# Patient Record
Sex: Male | Born: 1945
Health system: Southern US, Community
[De-identification: ages and names within clinical notes are randomized; demographics above are authoritative.]

## PROBLEM LIST (undated history)

## (undated) DIAGNOSIS — Z973 Presence of spectacles and contact lenses: Secondary | ICD-10-CM

## (undated) DIAGNOSIS — F419 Anxiety disorder, unspecified: Secondary | ICD-10-CM

## (undated) DIAGNOSIS — I10 Essential (primary) hypertension: Secondary | ICD-10-CM

## (undated) DIAGNOSIS — E785 Hyperlipidemia, unspecified: Secondary | ICD-10-CM

## (undated) DIAGNOSIS — Z974 Presence of external hearing-aid: Secondary | ICD-10-CM

## (undated) DIAGNOSIS — R350 Frequency of micturition: Secondary | ICD-10-CM

## (undated) DIAGNOSIS — Z9889 Other specified postprocedural states: Secondary | ICD-10-CM

## (undated) DIAGNOSIS — I4949 Other premature depolarization: Secondary | ICD-10-CM

## (undated) DIAGNOSIS — C61 Malignant neoplasm of prostate: Secondary | ICD-10-CM

## (undated) DIAGNOSIS — M5030 Other cervical disc degeneration, unspecified cervical region: Secondary | ICD-10-CM

## (undated) DIAGNOSIS — M109 Gout, unspecified: Secondary | ICD-10-CM

## (undated) DIAGNOSIS — R351 Nocturia: Secondary | ICD-10-CM

## (undated) DIAGNOSIS — N200 Calculus of kidney: Secondary | ICD-10-CM

## (undated) HISTORY — PX: PROSTATE BIOPSY: SHX241

---

## 2004-08-10 ENCOUNTER — Emergency Department: Payer: Self-pay | Admitting: Emergency Medicine

## 2007-02-02 ENCOUNTER — Ambulatory Visit: Payer: Self-pay | Admitting: Internal Medicine

## 2007-12-14 ENCOUNTER — Ambulatory Visit: Payer: Self-pay | Admitting: Internal Medicine

## 2008-01-18 ENCOUNTER — Ambulatory Visit: Payer: Self-pay | Admitting: Unknown Physician Specialty

## 2012-03-29 ENCOUNTER — Ambulatory Visit: Payer: Self-pay | Admitting: Gastroenterology

## 2012-04-03 LAB — PATHOLOGY REPORT

## 2017-08-17 ENCOUNTER — Ambulatory Visit: Payer: Medicare Other | Admitting: Anesthesiology

## 2017-08-17 ENCOUNTER — Encounter
Admission: RE | Disposition: A | Discharge status: Home / Self Care | Payer: Self-pay | Source: Ambulatory Visit | Attending: Internal Medicine

## 2017-08-17 ENCOUNTER — Encounter: Payer: Self-pay | Admitting: *Deleted

## 2017-08-17 ENCOUNTER — Ambulatory Visit
Admission: RE | Admit: 2017-08-17 | Discharge: 2017-08-17 | Disposition: A | Discharge status: Home / Self Care | Payer: Medicare Other | Source: Ambulatory Visit | Attending: Internal Medicine | Admitting: Internal Medicine

## 2017-08-17 DIAGNOSIS — Z1211 Encounter for screening for malignant neoplasm of colon: Secondary | ICD-10-CM | POA: Diagnosis not present

## 2017-08-17 DIAGNOSIS — Z79899 Other long term (current) drug therapy: Secondary | ICD-10-CM | POA: Diagnosis not present

## 2017-08-17 DIAGNOSIS — I10 Essential (primary) hypertension: Secondary | ICD-10-CM | POA: Insufficient documentation

## 2017-08-17 DIAGNOSIS — D123 Benign neoplasm of transverse colon: Secondary | ICD-10-CM | POA: Diagnosis not present

## 2017-08-17 DIAGNOSIS — Z86010 Personal history of colonic polyps: Secondary | ICD-10-CM | POA: Insufficient documentation

## 2017-08-17 DIAGNOSIS — Z87891 Personal history of nicotine dependence: Secondary | ICD-10-CM | POA: Diagnosis not present

## 2017-08-17 DIAGNOSIS — K640 First degree hemorrhoids: Secondary | ICD-10-CM | POA: Insufficient documentation

## 2017-08-17 DIAGNOSIS — Z8546 Personal history of malignant neoplasm of prostate: Secondary | ICD-10-CM | POA: Diagnosis not present

## 2017-08-17 DIAGNOSIS — Z7982 Long term (current) use of aspirin: Secondary | ICD-10-CM | POA: Insufficient documentation

## 2017-08-17 HISTORY — PX: COLONOSCOPY WITH PROPOFOL: SHX5780

## 2017-08-17 HISTORY — DX: Essential (primary) hypertension: I10

## 2017-08-17 HISTORY — DX: Malignant neoplasm of prostate: C61

## 2017-08-17 SURGERY — COLONOSCOPY WITH PROPOFOL
Anesthesia: General

## 2017-08-17 MED ORDER — PROPOFOL 500 MG/50ML IV EMUL
INTRAVENOUS | Status: DC | PRN
  Administered 2017-08-17: 50 ug/kg/min via INTRAVENOUS

## 2017-08-17 MED ORDER — LACTATED RINGERS IV SOLN
INTRAVENOUS | Status: DC | PRN
  Administered 2017-08-17: 14:00:00 via INTRAVENOUS

## 2017-08-17 MED ORDER — PROPOFOL 10 MG/ML IV BOLUS
INTRAVENOUS | Status: DC | PRN
  Administered 2017-08-17: 100 mg via INTRAVENOUS

## 2017-08-17 MED ORDER — SODIUM CHLORIDE 0.9 % IV SOLN
INTRAVENOUS | Status: DC
  Administered 2017-08-17: 13:00:00 via INTRAVENOUS

## 2017-08-17 MED ORDER — LIDOCAINE HCL (CARDIAC) 20 MG/ML IV SOLN
INTRAVENOUS | Status: DC | PRN
  Administered 2017-08-17: 40 mg via INTRAVENOUS

## 2017-08-17 NOTE — Transfer of Care (Signed)
Immediate Anesthesia Transfer of Care Note  Patient: Christian Hay Sr.  Procedure(s) Performed: COLONOSCOPY WITH PROPOFOL (N/A )  Patient Location: PACU and Endoscopy Unit  Anesthesia Type:General  Level of Consciousness: awake  Airway & Oxygen Therapy: Patient Spontanous Breathing  Post-op Assessment: Report given to RN  Post vital signs: Reviewed and stable  Last Vitals:  Vitals:   08/17/17 1255  BP: 129/65  Pulse: 76  Resp: 16  Temp: (!) 35.8 C  SpO2: 97%    Last Pain:  Vitals:   08/17/17 1255  TempSrc: Tympanic         Complications: No apparent anesthesia complications

## 2017-08-17 NOTE — H&P (Signed)
Outpatient short stay form Pre-procedure 08/17/2017 1:34 PM Kynzleigh Bandel K. Alice Reichert, M.D.  Primary Physician: Emily Filbert, M.D.  Reason for visit:  Personal hx of adenomatous colon polyps.  History of present illness:  Last colonoscopy in 2013 showed a single adenoma. Patient denies abdominal pain, change in bowel habits, or rectal bleeding.    Current Facility-Administered Medications:  .  0.9 %  sodium chloride infusion, , Intravenous, Continuous, [place], Benay Pike, MD, Last Rate: 20 mL/hr at 08/17/17 1313  Medications Prior to Admission  Medication Sig Dispense Refill Last Dose  . allopurinol (ZYLOPRIM) 300 MG tablet Take 300 mg by mouth daily.   08/16/2017 at Unknown time  . ALPRAZolam (XANAX) 0.25 MG tablet Take 0.25 mg by mouth at bedtime as needed for anxiety.   Past Month at Unknown time  . aspirin EC 81 MG tablet Take 81 mg by mouth daily.   08/16/2017 at Unknown time  . Cholecalciferol 1000 units capsule Take 1,000 Units by mouth daily.   08/16/2017 at Unknown time  . docusate sodium (COLACE) 100 MG capsule Take 100 mg by mouth daily.   Past Week at Unknown time  . lisinopril-hydrochlorothiazide (PRINZIDE,ZESTORETIC) 20-12.5 MG tablet Take 1 tablet by mouth daily.   08/16/2017 at Unknown time  . magnesium oxide (MAG-OX) 400 MG tablet Take 400 mg by mouth daily.   08/16/2017 at Unknown time  . sildenafil (REVATIO) 20 MG tablet Take 20 mg by mouth as needed.     . simvastatin (ZOCOR) 20 MG tablet Take 20 mg by mouth daily.   08/16/2017 at Unknown time  . colchicine 0.6 MG tablet Take 1.2 mg by mouth as needed.   Not Taking at Unknown time     Not on File   Past Medical History:  Diagnosis Date  . Hypertension   . Prostate cancer [facility])     Review of systems:      Physical Exam  General appearance: alert, cooperative and appears stated age Resp: clear to auscultation bilaterally Cardio: regular rate and rhythm, S1, S2 normal, no murmur, click, rub or gallop GI: soft, non-tender;  bowel sounds normal; no masses,  no organomegaly Extremities: extremities normal, atraumatic, no cyanosis or edema     Planned procedures: Colonoscopy. The patient understands the nature of the planned procedure, indications, risks, alternatives and potential complications including but not limited to bleeding, infection, perforation, damage to internal organs and possible oversedation/side effects from anesthesia. The patient agrees and gives consent to proceed.  Please refer to procedure notes for findings, recommendations and patient disposition/instructions.    Beverlee Wilmarth K. Alice Reichert, M.D. Gastroenterology 08/17/2017  1:34 PM

## 2017-08-17 NOTE — Interval H&P Note (Signed)
History and Physical Interval Note:  08/17/2017 1:37 PM  Christian Leron Croak Sr.  has presented today for surgery, with the diagnosis of PERSONAL HX.OF COLON POLYPS  The various methods of treatment have been discussed with the patient and family. After consideration of risks, benefits and other options for treatment, the patient has consented to  Procedure(s): COLONOSCOPY WITH PROPOFOL (N/A) as a surgical intervention .  The patient's history has been reviewed, patient examined, no change in status, stable for surgery.  I have reviewed the patient's chart and labs.  Questions were answered to the patient's satisfaction.     On[place], [place]

## 2017-08-17 NOTE — Anesthesia Postprocedure Evaluation (Signed)
Anesthesia Post Note  Patient: Christian ANDUJO Sr.  Procedure(s) Performed: COLONOSCOPY WITH PROPOFOL (N/A )  Patient location during evaluation: Endoscopy Anesthesia Type: General Level of consciousness: awake Pain management: satisfactory to patient Vital Signs Assessment: post-procedure vital signs reviewed and stable Respiratory status: spontaneous breathing Cardiovascular status: blood pressure returned to baseline Postop Assessment: no headache Anesthetic complications: no     Last Vitals:  Vitals:   08/17/17 1255  BP: 129/65  Pulse: 76  Resp: 16  Temp: (!) 35.8 C  SpO2: 97%    Last Pain:  Vitals:   08/17/17 1255  TempSrc: Tympanic                 Philbert Riser

## 2017-08-17 NOTE — Anesthesia Post-op Follow-up Note (Signed)
Anesthesia QCDR form completed.        

## 2017-08-17 NOTE — Op Note (Signed)
[facility] Patient Name: Christian Pope Procedure Date: 08/17/2017 1:21 PM MRN: [id number] Account #: [id number] Date of Birth: 16-Jul-1946 Admit Type: Outpatient Age: 72 Room: [facility] ENDO ROOM 3 Gender: Male Note Status: Finalized Procedure:            Colonoscopy Indications:          High risk colon cancer surveillance: Personal history                        of non-advanced adenoma, High risk colon cancer                        surveillance: Personal history of adenoma less than 10                        mm in size Providers:            Pasqual Farias K. Alice Reichert MD, MD Referring MD:         Rusty Aus, MD (Referring MD) Medicines:            Propofol per Anesthesia Complications:        No immediate complications. Procedure:            Pre-Anesthesia Assessment:                       - The risks and benefits of the procedure and the                        sedation options and risks were discussed with the                        patient. All questions were answered and informed                        consent was obtained.                       - Patient identification and proposed procedure were                        verified prior to the procedure by the nurse. The                        procedure was verified in the procedure room.                       - ASA Grade Assessment: II - A patient with mild                        systemic disease.                       - After reviewing the risks and benefits, the patient                        was deemed in satisfactory condition to undergo the                        procedure.  After obtaining informed consent, the colonoscope was                        passed under direct vision. Throughout the procedure,                        the patient's blood pressure, pulse, and oxygen                        saturations were monitored continuously. The                        Colonoscope was  introduced through the anus and                        advanced to the the cecum, identified by appendiceal                        orifice and ileocecal valve. The colonoscopy was                        performed without difficulty. The patient tolerated the                        procedure well. The quality of the bowel preparation                        was good. The ileocecal valve, appendiceal orifice, and                        rectum were photographed. Findings:      The perianal and digital rectal examinations were normal. Pertinent       negatives include normal sphincter tone and no palpable rectal lesions.      Four sessile polyps were found in the transverse colon. The polyps were       3 to 5 mm in size. These polyps were removed with a hot snare. Resection       and retrieval were complete.      Non-bleeding internal hemorrhoids were found during retroflexion. The       hemorrhoids were Grade I (internal hemorrhoids that do not prolapse).      The exam was otherwise without abnormality. Impression:           - Four 3 to 5 mm polyps in the transverse colon,                        removed with a hot snare. Resected and retrieved.                       - Non-bleeding internal hemorrhoids.                       - The examination was otherwise normal. Recommendation:       - Patient has a contact number available for                        emergencies. The signs and symptoms of potential                        delayed  complications were discussed with the patient.                        Return to normal activities tomorrow. Written discharge                        instructions were provided to the patient.                       - Resume previous diet.                       - Continue present medications.                       - Await pathology results.                       - Repeat colonoscopy for surveillance based on                        pathology results.                        - Return to GI office PRN.                       - The findings and recommendations were discussed with                        the patient and their spouse. Procedure Code(s):    --- Professional ---                       [phone], Colonoscopy, flexible; with removal of tumor(s),                        polyp(s), or other lesion(s) by snare technique Diagnosis Code(s):    --- Professional ---                       Z86.010, Personal history of colonic polyps                       K64.0, First degree hemorrhoids                       D12.3, Benign neoplasm of transverse colon (hepatic                        flexure or splenic flexure) CPT copyright 2016 American Medical Association. All rights reserved. The codes documented in this report are preliminary and upon coder review may  be revised to meet current compliance requirements. Efrain Sella MD, MD 08/17/2017 2:03:41 PM This report has been signed electronically. Number of Addenda: 0 Note Initiated On: 08/17/2017 1:21 PM Scope Withdrawal Time: 0 hours 11 minutes 47 seconds  Total Procedure Duration: 0 hours 16 minutes 12 seconds       [facility]

## 2017-08-17 NOTE — Anesthesia Preprocedure Evaluation (Addendum)
Anesthesia Evaluation  Patient identified by MRN, date of birth, ID band Patient awake    Reviewed: Allergy & Precautions, NPO status , Patient's Chart, lab work & pertinent test results  Airway Mallampati: III  TM Distance: <3 FB     Dental  (+) Caps, Chipped   Pulmonary former smoker,    Pulmonary exam normal        Cardiovascular hypertension, Normal cardiovascular exam     Neuro/Psych negative neurological ROS  negative psych ROS   GI/Hepatic negative GI ROS, Neg liver ROS,   Endo/Other  negative endocrine ROS  Renal/GU negative Renal ROS     Musculoskeletal negative musculoskeletal ROS (+)   Abdominal Normal abdominal exam  (+)   Peds negative pediatric ROS (+)  Hematology negative hematology ROS (+)   Anesthesia Other Findings Past Medical History: No date: Hypertension No date: Prostate cancer (HCC)  Reproductive/Obstetrics                            Anesthesia Physical Anesthesia Plan  ASA: II  Anesthesia Plan: General   Post-op Pain Management:    Induction: Intravenous  PONV Risk Score and Plan:   Airway Management Planned: Nasal Cannula  Additional Equipment:   Intra-op Plan:   Post-operative Plan:   Informed Consent: I have reviewed the patients History and Physical, chart, labs and discussed the procedure including the risks, benefits and alternatives for the proposed anesthesia with the patient or authorized representative who has indicated his/her understanding and acceptance.   Dental advisory given  Plan Discussed with: CRNA and Surgeon  Anesthesia Plan Comments:         Anesthesia Quick Evaluation

## 2017-08-18 ENCOUNTER — Encounter: Payer: Self-pay | Admitting: Internal Medicine

## 2017-08-19 LAB — SURGICAL PATHOLOGY

## 2017-10-20 ENCOUNTER — Other Ambulatory Visit: Payer: Self-pay | Admitting: Urology

## 2017-10-20 DIAGNOSIS — C61 Malignant neoplasm of prostate: Secondary | ICD-10-CM

## 2017-11-02 ENCOUNTER — Ambulatory Visit
Admission: RE | Admit: 2017-11-02 | Discharge: 2017-11-02 | Disposition: A | Discharge status: Home / Self Care | Payer: Medicare Other | Source: Ambulatory Visit | Attending: Urology | Admitting: Urology

## 2017-11-02 DIAGNOSIS — C61 Malignant neoplasm of prostate: Secondary | ICD-10-CM

## 2017-11-02 MED ORDER — GADOBENATE DIMEGLUMINE 529 MG/ML IV SOLN
20.0000 mL | Freq: Once | INTRAVENOUS | Status: AC | PRN
  Administered 2017-11-02: 20 mL via INTRAVENOUS

## 2017-11-23 ENCOUNTER — Encounter: Payer: Self-pay | Admitting: Radiation Oncology

## 2017-12-13 ENCOUNTER — Encounter: Payer: Self-pay | Admitting: Radiation Oncology

## 2017-12-13 NOTE — Progress Notes (Signed)
GU Location of Tumor / Histology: prostatic adenocarcinoma  If Prostate Cancer, Gleason Score is (3 + 4) and PSA is (8.38). Prostate volume: 47 cc  Reilley D Freda Munro Sr. was diagnosed with prostate cancer in April 2017. He has been under active surveillance since that time. Elevated PSA prompted an MRI scan which revealed a PIRADS 3 and 4 lesion within the prostate. Fusion biopsy done 11/15/2017.      Past/Anticipated interventions by urology, if any: biopsy, active surveillance, biopsy, referral to radiation oncology for radioactive seeds  Past/Anticipated interventions by medical oncology, if any: no  Weight changes, if any: no  Bowel/Bladder complaints, if any: IPSS 8. Denies dysuria or hematuria. Denies incontinence. Reports rare scant leakage with very strenuous activity.  Nausea/Vomiting, if any: no  Pain issues, if any:  no  SAFETY ISSUES:  Prior radiation? no  Pacemaker/ICD? no  Possible current pregnancy? no  Is the patient on methotrexate? no  Current Complaints / other details:  72 year old male. Married. Retired.

## 2017-12-14 ENCOUNTER — Encounter: Payer: Self-pay | Admitting: Radiation Oncology

## 2017-12-14 ENCOUNTER — Ambulatory Visit
Admission: RE | Admit: 2017-12-14 | Discharge: 2017-12-14 | Disposition: A | Discharge status: Home / Self Care | Payer: Medicare Other | Source: Ambulatory Visit | Attending: Radiation Oncology | Admitting: Radiation Oncology

## 2017-12-14 ENCOUNTER — Other Ambulatory Visit: Payer: Self-pay

## 2017-12-14 DIAGNOSIS — C61 Malignant neoplasm of prostate: Secondary | ICD-10-CM

## 2017-12-14 DIAGNOSIS — Z8042 Family history of malignant neoplasm of prostate: Secondary | ICD-10-CM | POA: Insufficient documentation

## 2017-12-14 DIAGNOSIS — Z803 Family history of malignant neoplasm of breast: Secondary | ICD-10-CM | POA: Insufficient documentation

## 2017-12-14 DIAGNOSIS — Z87891 Personal history of nicotine dependence: Secondary | ICD-10-CM | POA: Insufficient documentation

## 2017-12-14 DIAGNOSIS — F419 Anxiety disorder, unspecified: Secondary | ICD-10-CM | POA: Diagnosis not present

## 2017-12-14 DIAGNOSIS — Z7982 Long term (current) use of aspirin: Secondary | ICD-10-CM | POA: Diagnosis not present

## 2017-12-14 DIAGNOSIS — Z79899 Other long term (current) drug therapy: Secondary | ICD-10-CM | POA: Insufficient documentation

## 2017-12-14 DIAGNOSIS — I10 Essential (primary) hypertension: Secondary | ICD-10-CM | POA: Diagnosis not present

## 2017-12-14 HISTORY — DX: Anxiety disorder, unspecified: F41.9

## 2017-12-14 NOTE — Progress Notes (Signed)
See progress note under physician encounter. 

## 2017-12-14 NOTE — Progress Notes (Signed)
Radiation Oncology         [phone] ________________________________  Initial Outpatient Consultation  Name: Christian DETWILER Sr. MRN: [id number]  Date: 12/14/2017  DOB: [birth year]  JE:HUDJSH, Christian Grief, MD  Kathie Rhodes, MD   REFERRING PHYSICIAN: Kathie Rhodes, MD  DIAGNOSIS: 72 y.o. gentleman with Stage T1c adenocarcinoma of the prostate with Gleason Score of 3+4, and PSA of 8.38    ICD-10-CM   1. Malignant neoplasm of prostate ([place]) C61     HISTORY OF PRESENT ILLNESS: Christian Pope Sr. is a 72 y.o. male with a diagnosis of prostate cancer. He is a well established patient with Dr. Karsten Pope, initially diagnosed with a T1c Gleason 3+3 adenocarcinoma of the prostate with a PSA of 6.13 in 10/2014.  At that time, he elected to proceed with active surveillance and has continued in routine follow up with Dr. Karsten Pope since that time.     PSA trend: 10/08/16 5.82 04/21/17 6.90 10/13/17 8.38     Repeat PSA on 10/13/17 was 8.38. The patient presented to Dr. Karsten Pope on 10/20/17 and DRE continued to be stable with no nodularity. Elevated PSA prompted an MRI of the prostate on 11/02/17. This showed an 11 mm low T2 lesion in the lateral right mid gland to apex, likely corresponding to the patient's known macroscopic prostate cancer, with possible high-grade tumor. PI-RADS 4. 11 mm low T2 lesion in the left posterolateral apex, possibly reflecting additional site of low-grade tumor, PI-RADS 3. No evidence of extracapsular extension, seminal vesicle invasion, or metastatic disease.   The patient underwent MRI fusion targeted transrectal ultrasound with 12 biopsies of the prostate on 11/15/17. The prostate volume measured 57 cc. Out of 12 core biopsies, 7 were positive. The maximum Gleason score was 3+4, and this was seen in the left mid lateral and right mid. Gleason 3+3 was seen in the left base lateral, left apex lateral, left apex, right base lateral, and right apex lateral.     The patient reviewed  the biopsy results with his urologist and he has kindly been referred today for discussion of potential radiation treatment options.   PREVIOUS RADIATION THERAPY: No  PAST MEDICAL HISTORY:  Past Medical History:  Diagnosis Date  . Anxiety    history of anxiety/tx with xanax  . Hypertension   . Prostate cancer ([place])       PAST SURGICAL HISTORY: Past Surgical History:  Procedure Laterality Date  . COLONOSCOPY WITH PROPOFOL N/A 08/17/2017   Procedure: COLONOSCOPY WITH PROPOFOL;  Surgeon: [place], Benay Pike, MD;  Location: [facility] ENDOSCOPY;  Service: Gastroenterology;  Laterality: N/A;  . PROSTATE BIOPSY     x2    FAMILY HISTORY:  Family History  Problem Relation Age of Onset  . Melanoma Father   . Prostate cancer Father   . Breast cancer Sister   . Breast cancer Sister     SOCIAL HISTORY:  Social History   Socioeconomic History  . Marital status: Married    Spouse name: Not on file  . Number of children: Not on file  . Years of education: Not on file  . Highest education level: Not on file  Occupational History  . Occupation: retired  Scientific laboratory technician  . Financial resource strain: Not on file  . Food insecurity:    Worry: Not on file    Inability: Not on file  . Transportation needs:    Medical: Not on file    Non-medical: Not on file  Tobacco Use  .  Smoking status: Former Smoker    Packs/day: 0.25    Years: 2.00    Pack years: 0.50    Types: Cigars    Last attempt to quit: 08/09/2017    Years since quitting: 0.3  . Smokeless tobacco: Former Systems developer    Types: [place] date: 08/09/1973  Substance and Sexual Activity  . Alcohol use: Yes    Comment: occasionally  . Drug use: No  . Sexual activity: Yes  Lifestyle  . Physical activity:    Days per week: Not on file    Minutes per session: Not on file  . Stress: Not on file  Relationships  . Social connections:    Talks on phone: Not on file    Gets together: Not on file    Attends religious service: Not on file     Active member of club or organization: Not on file    Attends meetings of clubs or organizations: Not on file    Relationship status: Not on file  . Intimate partner violence:    Fear of current or ex partner: Not on file    Emotionally abused: Not on file    Physically abused: Not on file    Forced sexual activity: Not on file  Other Topics Concern  . Not on file  Social History Narrative   Resides in [place], [place] between [place] and [place]. Approximately one hour drive to [place]. Has two adult children. Retired from [employer]. Has three grandchildren.     ALLERGIES: Patient has no known allergies.  MEDICATIONS:  Current Outpatient Medications  Medication Sig Dispense Refill  . allopurinol (ZYLOPRIM) 300 MG tablet Take by mouth.    . ALPRAZolam (XANAX) 0.25 MG tablet Take by mouth.    Marland Kitchen aspirin EC 81 MG tablet Take by mouth.    . Cholecalciferol (VITAMIN D-1000 MAX ST) 1000 units tablet Take by mouth.    . clotrimazole-betamethasone (LOTRISONE) cream Apply topically.    . docusate sodium (COLACE) 100 MG capsule Take 100 mg by mouth daily.    Marland Kitchen lisinopril-hydrochlorothiazide (PRINZIDE,ZESTORETIC) 20-12.5 MG tablet TAKE 1 TABLET BY MOUTH ONCE A DAY    . magnesium oxide (MAG-OX) 400 MG tablet Take by mouth.    . sildenafil (REVATIO) 20 MG tablet Take by mouth.    . simvastatin (ZOCOR) 20 MG tablet Take by mouth.    . colchicine 0.6 MG tablet Take 2 tablets (1.75m) by mouth at first sign of gout flare followed by 1 tablet (0.6370m after 1 hour. (Max 1.70m66mithin 1 hour)     No current facility-administered medications for this encounter.     REVIEW OF SYSTEMS:  On review of systems, the patient reports that he is doing well overall. He denies any chest pain, shortness of breath, cough, fevers, chills, night sweats, unintended weight changes. He denies any bowel disturbances, and denies abdominal pain, nausea or vomiting. He denies any new musculoskeletal or joint aches or  pains. His IPSS was 8, indicating moderate urinary symptoms. He is able to complete sexual activity with almost all attempts. A complete review of systems is obtained and is otherwise negative.    PHYSICAL EXAM:  Wt Readings from Last 3 Encounters:  12/14/17 227 lb 6.4 oz (103.1 kg)  08/17/17 221 lb (100.2 kg)   Temp Readings from Last 3 Encounters:  12/14/17 97.8 F (36.6 C) (Oral)  08/17/17 97.8 F (36.6 C) (Tympanic)   BP Readings from Last 3 Encounters:  12/14/17 140/78  08/17/17 (!) 111/50   Pulse Readings from Last 3 Encounters:  12/14/17 84  08/17/17 70   Pain Assessment Pain Score: 0-No pain/10  In general this is a well appearing caucasian gentleman in no acute distress. He is alert and oriented x4 and appropriate throughout the examination. HEENT reveals that the patient is normocephalic, atraumatic. EOMs are intact. PERRLA. Skin is intact without any evidence of gross lesions. Cardiovascular exam reveals a regular rate and rhythm, no clicks rubs or murmurs are auscultated. Chest is clear to auscultation bilaterally. Lymphatic assessment is performed and does not reveal any adenopathy in the cervical, supraclavicular, axillary, or inguinal chains. Abdomen has active bowel sounds in all quadrants and is intact. The abdomen is soft, non tender, non distended. Lower extremities are negative for pretibial pitting edema, deep calf tenderness, cyanosis or clubbing.   KPS = 100  100 - Normal; no complaints; no evidence of disease. 90   - Able to carry on normal activity; minor signs or symptoms of disease. 80   - Normal activity with effort; some signs or symptoms of disease. 7   - Cares for self; unable to carry on normal activity or to do active work. 60   - Requires occasional assistance, but is able to care for most of his personal needs. 50   - Requires considerable assistance and frequent medical care. 29   - Disabled; requires special care and assistance. 83   -  Severely disabled; hospital admission is indicated although death not imminent. 37   - Very sick; hospital admission necessary; active supportive treatment necessary. 10   - Moribund; fatal processes progressing rapidly. 0     - Dead  Karnofsky DA, Abelmann WH, Craver LS and Burchenal JH [phone]) The use of the nitrogen mustards in the palliative treatment of carcinoma: with particular reference to bronchogenic carcinoma Cancer 1 634-56  LABORATORY DATA:  No results found for: WBC, HGB, HCT, MCV, PLT No results found for: NA, K, CL, CO2 No results found for: ALT, AST, GGT, ALKPHOS, BILITOT   RADIOGRAPHY: No results found.    IMPRESSION/PLAN: 1. 72 y.o. gentleman with Stage T1c adenocarcinoma of the prostate with Gleason Score of 3+4, and PSA of 8.38. We discussed the patient's workup and outlined the nature of prostate cancer in this setting. The patient's T stage, Gleason's score, and PSA put him into the favorable intermediate risk group. Accordingly, he is eligible for a variety of potential treatment options including prostatectomy, brachytherapy, or 5.5 - 8 weeks of external radiation. We discussed the available radiation techniques, and focused on the details and logistics and delivery. We discussed and outlined the risks, benefits, short and long-term effects associated with radiotherapy and compared and contrasted these with prostatectomy. He is adamantly not interested in prostatectomy.  We also discussed the role of SpaceOAR in reducing the rectal toxicity associated with radiotherapy.  At the conclusion of our conversation, the patient is interested in moving forward with brachytherapy and use of SpaceOAR to reduce rectal toxicity from radiotherapy.  We will share our discussion with Dr. Karsten Pope and move forward with scheduling his CT [facility] planning appointment in the near future.  The patient met briefly with Romie Jumper in our office who will be working closely with him to coordinate OR  scheduling and pre and post procedure appointments.  We will contact the pharmaceutical rep to ensure that [place] is available at the time of procedure.  He will have a prostate MRI following  his post-seed CT SIM to confirm appropriate distribution of the [place].    [name], [name]    Tyler Pita, MD  [facility] Direct Dial: [phone]  Fax: [phone] [email or web address]  Skype  [email or web address]    Page Me   This document serves as a record of services personally performed by Tyler Pita, MD and Freeman Caldron, [name]. It was created on their behalf by Bethann Humble, a trained medical scribe. The creation of this record is based on the scribe's personal observations and the provider's statements to them. This document has been checked and approved by the attending provider.

## 2017-12-16 ENCOUNTER — Telehealth: Payer: Self-pay | Admitting: *Deleted

## 2017-12-16 NOTE — Telephone Encounter (Signed)
CALLED PATIENT TO INFORM OF PRE-SEED PLANNING CT FOR - 01/04/18- ARRIVAL TIME - 8:45 AM, SPOKE WITH PATIENT AND HE IS AWARE OF THIS APPT.

## 2017-12-20 ENCOUNTER — Telehealth: Payer: Self-pay | Admitting: *Deleted

## 2017-12-20 ENCOUNTER — Telehealth: Payer: Self-pay | Admitting: Medical Oncology

## 2017-12-20 NOTE — Telephone Encounter (Signed)
Spoke with Christian Pope and introduced myself as the prostate nurse navigator and my role. I was unable to meet him the day he consulted with Dr. Lesle Reek. He states the consult went well and he has done lots of research on the seed implant. He did not have any questions at this time but voiced he will call if any arise. He is scheduled for CT simulation 01/01/18 and he is aware of this appointment.

## 2017-12-20 NOTE — Telephone Encounter (Signed)
Called patient to inform of pre-seed planning CT has been moved back to 01-04-18, spoke with patient and he is aware of these appts.

## 2017-12-20 NOTE — Telephone Encounter (Signed)
Called patient to inform of pre-seed planning CT for 01-06-18, spoke with patient and he is aware of these appts.

## 2017-12-30 ENCOUNTER — Other Ambulatory Visit: Payer: Self-pay | Admitting: Urology

## 2018-01-03 ENCOUNTER — Telehealth: Payer: Self-pay | Admitting: *Deleted

## 2018-01-03 NOTE — Telephone Encounter (Signed)
CALLED PATIENT TO REMIND OF PRE-SEED PLANNING , CHEST X-Eithen AND EKG FOR 01-04-18, SPOKE WITH PATIENT AND HE IS AWARE OF THESE APPTS.

## 2018-01-04 ENCOUNTER — Ambulatory Visit: Payer: Self-pay | Admitting: Radiation Oncology

## 2018-01-04 ENCOUNTER — Ambulatory Visit: Payer: Medicare Other | Admitting: Radiation Oncology

## 2018-01-04 ENCOUNTER — Encounter (HOSPITAL_COMMUNITY)
Admission: RE | Admit: 2018-01-04 | Discharge: 2018-01-04 | Disposition: A | Discharge status: Home / Self Care | Payer: Medicare Other | Source: Ambulatory Visit | Attending: Urology | Admitting: Urology

## 2018-01-04 ENCOUNTER — Ambulatory Visit
Admission: RE | Admit: 2018-01-04 | Discharge: 2018-01-04 | Disposition: A | Discharge status: Home / Self Care | Payer: Medicare Other | Source: Ambulatory Visit | Attending: Radiation Oncology | Admitting: Radiation Oncology

## 2018-01-04 ENCOUNTER — Ambulatory Visit (HOSPITAL_COMMUNITY)
Admission: RE | Admit: 2018-01-04 | Discharge: 2018-01-04 | Disposition: A | Discharge status: Home / Self Care | Payer: Medicare Other | Source: Ambulatory Visit | Attending: Urology | Admitting: Urology

## 2018-01-04 DIAGNOSIS — Z510 Encounter for antineoplastic radiation therapy: Secondary | ICD-10-CM | POA: Diagnosis not present

## 2018-01-04 DIAGNOSIS — C61 Malignant neoplasm of prostate: Secondary | ICD-10-CM | POA: Insufficient documentation

## 2018-01-04 NOTE — Progress Notes (Signed)
  Radiation Oncology         [phone]) [phone] ________________________________  Name: Christian Hay Sr. MRN: [id number]  Date: 01/04/2018  DOB: August 28, 1945  SIMULATION AND TREATMENT PLANNING NOTE PUBIC ARCH STUDY  HA:LPFXTK, Christean Grief, MD  Kathie Rhodes, MD  DIAGNOSIS: 72 y.o. male with Stage T1c adenocarcinoma of the prostate with Gleason Score of 3+4, and PSA of 8.38     ICD-10-CM   1. Malignant neoplasm of prostate ([place]) C61     COMPLEX SIMULATION:  The patient presented today for evaluation for possible prostate seed implant. He was brought to the radiation planning suite and placed supine on the CT couch. A 3-dimensional image study set was obtained in upload to the planning computer. There, on each axial slice, I contoured the prostate gland. Then, using three-dimensional radiation planning tools I reconstructed the prostate in view of the structures from the transperineal needle pathway to assess for possible pubic arch interference. In doing so, I did not appreciate any pubic arch interference. Also, the patient's prostate volume was estimated based on the drawn structure. The volume was 53 cc.  Given the pubic arch appearance and prostate volume, patient remains a good candidate to proceed with prostate seed implant. Today, he freely provided informed written consent to proceed.    PLAN: The patient will undergo prostate seed implant.   ________________________________  Sheral Apley. Tammi Klippel, M.D.  This document serves as a record of services personally performed by Tyler Pita, MD. It was created on his behalf by Rae Lips, a trained medical scribe. The creation of this record is based on the scribe's personal observations and the provider's statements to them. This document has been checked and approved by the attending provider.

## 2018-01-06 ENCOUNTER — Ambulatory Visit: Payer: Medicare Other | Admitting: Radiation Oncology

## 2018-02-22 ENCOUNTER — Other Ambulatory Visit: Payer: Self-pay | Admitting: Urology

## 2018-02-22 DIAGNOSIS — C61 Malignant neoplasm of prostate: Secondary | ICD-10-CM

## 2018-03-03 ENCOUNTER — Telehealth: Payer: Self-pay | Admitting: *Deleted

## 2018-03-03 NOTE — Telephone Encounter (Signed)
CALLED PATIENT TO REMIND OF LABS FOR PROCEDURE ON 03-06-18 - ARRIVAL TIME - 12:45 PM @ [facility] ADMITTING, SPOKE WITH PATIENT AND HE IS AWARE OF THIS APPT.

## 2018-03-06 ENCOUNTER — Encounter (HOSPITAL_COMMUNITY)
Admission: RE | Admit: 2018-03-06 | Discharge: 2018-03-06 | Disposition: A | Discharge status: Home / Self Care | Payer: Medicare Other | Source: Ambulatory Visit | Attending: Urology | Admitting: Urology

## 2018-03-06 DIAGNOSIS — Z01812 Encounter for preprocedural laboratory examination: Secondary | ICD-10-CM | POA: Insufficient documentation

## 2018-03-06 LAB — COMPREHENSIVE METABOLIC PANEL
ALT: 38 U/L (ref 0–44)
AST: 27 U/L (ref 15–41)
Albumin: 4.1 g/dL (ref 3.5–5.0)
Alkaline Phosphatase: 72 U/L (ref 38–126)
Anion gap: 8 (ref 5–15)
BUN: 29 mg/dL — ABNORMAL HIGH (ref 8–23)
CO2: 27 mmol/L (ref 22–32)
Calcium: 9.5 mg/dL (ref 8.9–10.3)
Chloride: 105 mmol/L (ref 98–111)
Creatinine, Ser: 1.3 mg/dL — ABNORMAL HIGH (ref 0.61–1.24)
GFR calc Af Amer: >60 mL/min (ref >60)
GFR calc non Af Amer: 54 mL/min — ABNORMAL LOW (ref >60)
Glucose, Bld: 102 mg/dL — ABNORMAL HIGH (ref 70–99)
Potassium: 4.4 mmol/L (ref 3.5–5.1)
Sodium: 140 mmol/L (ref 135–145)
Total Bilirubin: 0.9 mg/dL (ref 0.3–1.2)
Total Protein: 7.3 g/dL (ref 6.5–8.1)

## 2018-03-06 LAB — PROTIME-INR
INR: 0.81
Prothrombin Time: 11.1 seconds — ABNORMAL LOW (ref 11.4–15.2)

## 2018-03-06 LAB — CBC
HCT: 45.5 % (ref 39.0–52.0)
Hemoglobin: 15.7 g/dL (ref 13.0–17.0)
MCH: 31.9 pg (ref 26.0–34.0)
MCHC: 34.5 g/dL (ref 30.0–36.0)
MCV: 92.5 fL (ref 78.0–100.0)
Platelets: 242 10*3/uL (ref 150–400)
RBC: 4.92 MIL/uL (ref 4.22–5.81)
RDW: 13.2 % (ref 11.5–15.5)
WBC: 9 10*3/uL (ref 4.0–10.5)

## 2018-03-06 LAB — APTT: aPTT: 28 seconds (ref 24–36)

## 2018-03-07 ENCOUNTER — Other Ambulatory Visit: Payer: Self-pay

## 2018-03-07 ENCOUNTER — Encounter (HOSPITAL_BASED_OUTPATIENT_CLINIC_OR_DEPARTMENT_OTHER): Payer: Self-pay | Admitting: *Deleted

## 2018-03-07 NOTE — Progress Notes (Addendum)
Spoke with patient via telephone for pre-op interview. No meds am of surgery. NPO after MN. Needs Istat. Labs and EKG in epic and on chart. Fleet enema AM of surgery day.

## 2018-03-10 ENCOUNTER — Telehealth: Payer: Self-pay | Admitting: *Deleted

## 2018-03-10 NOTE — Telephone Encounter (Signed)
CALLED PATIENT TO REMIND OF PROCEDURE FOR 03-13-18, SPOKE WITH PATIENT AND HE IS AWARE OF THIS PROCEDURE

## 2018-03-12 NOTE — Progress Notes (Signed)
  Radiation Oncology         [phone]) [phone] ________________________________  Name: Christian Hay Sr. MRN: [id number]  Date: 03/12/2018  DOB: 11-30-1945       Prostate Seed Implant  HR:CBULAG, Christean Grief, MD  No ref. provider found  DIAGNOSIS: 72 y.o. male with Stage T1c adenocarcinoma of the prostate with Gleason Score of 3+4, and PSA of 8.38    ICD-10-CM   1. Prostate cancer ([place]) C61 DG Chest 2 View    DG Chest 2 View    PROCEDURE: Insertion of radioactive I-125 seeds into the prostate gland.  RADIATION DOSE: 145 Gy, definitive therapy.  TECHNIQUE: Christian Hay Sr. was brought to the operating room with the urologist. He was placed in the dorsolithotomy position. He was catheterized and a rectal tube was inserted. The perineum was shaved, prepped and draped. The ultrasound probe was then introduced into the rectum to see the prostate gland.  TREATMENT DEVICE: A needle grid was attached to the ultrasound probe stand and anchor needles were placed.  3D PLANNING: The prostate was imaged in 3D using a sagittal sweep of the prostate probe. These images were transferred to the planning computer. There, the prostate, urethra and rectum were defined on each axial reconstructed image. Then, the software created an optimized 3D plan and a few seed positions were adjusted. The quality of the plan was reviewed using [facility] information for the target and the following two organs at risk:  Urethra and Rectum.  Then the accepted plan was printed and handed off to the radiation therapist.  Under my supervision, the custom loading of the seeds and spacers was carried out and loaded into sealed vicryl sleeves.  These pre-loaded needles were then placed into the needle holder.Marland Kitchen  PROSTATE VOLUME STUDY:  Using transrectal ultrasound the volume of the prostate was verified to be 64 cc.  SPECIAL TREATMENT PROCEDURE/SUPERVISION AND HANDLING: The pre-loaded needles were then delivered under sagittal guidance. A total  of 24 needles were used to deposit 85 seeds in the prostate gland. The individual seed activity was 0.507 mCi.  SpaceOAR:  Yes  COMPLEX SIMULATION: At the end of the procedure, an anterior radiograph of the pelvis was obtained to document seed positioning and count. Cystoscopy was performed to check the urethra and bladder.  MICRODOSIMETRY: At the end of the procedure, the patient was emitting 0.075 mR/hr at 1 meter. Accordingly, he was considered safe for hospital discharge.  PLAN: The patient will return to the radiation oncology clinic for post implant CT dosimetry in three weeks.   ________________________________  Christian Pope, M.D.

## 2018-03-12 NOTE — H&P (Signed)
HPI: Christian Pope is a 72 year-old male with biopsy-proven adenocarcinoma of the prostate who presents for radioactive seed implant.  His prostate cancer was diagnosed 04/22/2016. His PSA at his time of diagnosis was 6.13. His cancer was Gleason score 3+3 = 6 in 2 cores involving 5% of one core and <5% of the other core.Marland Kitchen   His most recent PSA is 8.38. The patient states he is on active surveillance. He has not undergone surgery for treatment. He has not undergone External Beam Radiation Therapy for treatment. He has not undergone Hormonal Therapy for treatment.   He does not have urinary incontinence. He has not recently had unwanted weight loss. He is not having new bone pain.   Adenocarcinoma of the prostate: He indicated that when his PSA went up previously a trial of antibiotics was undertaken but it did not result in any decrease of his PSA. His PSA in 11/16 was 7.4. He was found to have some subtle induration of the left lobe of his prostate on DRE.  TRUS/BX 10/08/14: He did have some subtle hypoechogenicity in the peripheral zone posteriorly on the left-hand side. Prostate volume was 54 cc.  Pathology: Adenocarcinoma Gleason score 3+3 = 6 in 2 cores involving 5% of one core and <5% of the other core.  Stage: T1c  Treatment: Active surveillance  Due to a rise in his PSA to 8.38 he underwent an MRI scan of the prostate which revealed a PIRADS 3 and 4 lesion within the prostate.  Fusion biopsy 11/15/17: Prostate volume - 47 cc  Pathology: Gleason 3 + 4 = 7 in 2 cores and 3+ 3 = 6 in 5 cores.  Stage: T1c   11/22/17: He returns having undergone fusion biopsy due to an elevated PSA and a history of low-grade, low volume prostate cancer. He reported having no difficulties following his prostate biopsy.     ALLERGIES: No Allergies    MEDICATIONS: Levaquin 750 mg tablet Take the morning of your prostate biopsy.  Allopurinol 300 mg tablet Oral  Alprazolam 0.25 mg tablet 0 Oral  Aspirin Ec 81 mg  tablet, delayed release Oral  Colchicine 0.6 mg tablet 0 Oral  Lisinopril-Hydrochlorothiazide 20 mg-12.5 mg tablet Oral  Magnesium 500 mg capsule Oral  Sildenafil Citrate 20 MG Oral Tablet 0 Oral  Simvastatin 20 mg tablet Oral     GU PSH: Prostate Needle Biopsy - 11/15/2017      PSH Notes: Tonsillectomy  tooth pulled   NON-GU PSH: Remove Tonsils - 2016 Surgical Pathology, Gross And Microscopic Examination For Prostate Needle - 11/15/2017    GU PMH: Prostate Cancer (Stable), I have discussed with the patient the possibility of blood per rectum, per urethra and in the ejaculate. He was counseled to contact me if he has any difficulties following his prostate biopsy whatsoever. - 11/15/2017, (Stable), I note his prostate is unchanged with no new nodularity or induration however his PSA has bumped up to 8.38. What I have recommended is we proceed with further evaluation since it has been a couple of years since his biopsy. He is going to undergo an MRI scan of the prostate and then depending on those results we will proceed either with a fusion biopsy or continued active surveillance., - 10/20/2017 (Stable), His prostate remains benign to examination. His PSA at 6.9 is up slightly from where it was previously but remains in line with his previous values. I recommended continued active surveillance with repeat DRE and PSA again in 6 months., -  04/26/2017 (Stable), His prostate is noted to be smooth and benign to exam. His PSA at 5.8 to actually has decreased the last 2 times I checked it. We again discussed the fact that a prostate biopsy will be necessary at some point but I did not recommend it at this time again with his PSA continuing to actually decrease., - 10/14/2016 (Stable), His DRE remains benign and unchanged. His PSA at 6.13 is down from what was in 3/17. I therefore have recommended continued active surveillance with repeat DRE and PSA again in 6 months., - 04/27/2016, Adenocarcinoma of prostate, -  2017 BPH w/o LUTS (Stable), He does have some prostatic enlargement noted on rectal exam but he is not having any significant voiding symptoms. - 10/14/2016 ED due to arterial insufficiency, Erectile dysfunction due to arterial insufficiency - 2017 Other Disorder Prostate, Prostate induration - 2016      PMH Notes: History of nephrolithiasis: He has passed a stone spontaneously previously but has not had any difficulty with stones since 2011.   Erectile dysfunction: He developed mild erectile dysfunction. This has been managed with phosphodiesterase inhibitor therapy effectively.  Current treatment: Sildenafil       NON-GU PMH: Encounter for general adult medical examination without abnormal findings, Encounter for preventive health examination - 2017 Anxiety, Anxiety - 2016 Personal history of other diseases of the circulatory system, History of cardiac murmur - 2016, History of hypertension, - 2016 Personal history of other diseases of the musculoskeletal system and connective tissue, History of arthritis - 2016, History of gout, - 2016, History of degenerative disc disease, - 2016 Personal history of other endocrine, nutritional and metabolic disease, History of hyperlipidemia - 2016    FAMILY HISTORY: malignant neoplasm - Runs In Family   SOCIAL HISTORY: Marital Status: Married Preferred Language: English; Ethnicity: Not Hispanic Or Latino; Race: White Current Smoking Status: Patient smokes occasionally. Smokes less than 1/2 pack per day.  Drinks 3 drinks per week. Types of alcohol consumed: Beer, Liquor, Wine. Light Drinker.  Drinks 2 caffeinated drinks per day. Patient's occupation is/was retired.     Notes: Alcohol use, Caffeine use, Married, Current some day smoker, Cigar smoker   REVIEW OF SYSTEMS:    GU Review Male:   Patient denies frequent urination, hard to postpone urination, burning/ pain with urination, get up at night to urinate, leakage of urine, stream starts and  stops, trouble starting your stream, have to strain to urinate , erection problems, and penile pain.  Gastrointestinal (Upper):   Patient denies nausea, vomiting, and indigestion/ heartburn.  Gastrointestinal (Lower):   Patient denies diarrhea and constipation.  Constitutional:   Patient denies weight loss, fever, fatigue, and night sweats.  Skin:   Patient denies skin rash/ lesion and itching.  Eyes:   Patient denies blurred vision and double vision.  Ears/ Nose/ Throat:   Patient denies sore throat and sinus problems.  Hematologic/Lymphatic:   Patient denies swollen glands and easy bruising.  Cardiovascular:   Patient denies leg swelling and chest pains.  Respiratory:   Patient denies cough and shortness of breath.  Endocrine:   Patient denies excessive thirst.  Musculoskeletal:   Patient denies back pain and joint pain.  Neurological:   Patient denies headaches and dizziness.  Psychologic:   Patient denies depression and anxiety.   VITAL SIGNS:  Weight 232 lb / 105.23 kg  Height 67 in / 170.18 cm  BP 110/57 mmHg  Pulse 68 /min  BMI 36.3 kg/m    Physical  Exam  Constitutional: Well nourished and well developed . No acute distress.   ENT:. The ears and nose are normal in appearance.   Neck: The appearance of the neck is normal and no neck mass is present.   Pulmonary: No respiratory distress and normal respiratory rhythm and effort.   Cardiovascular: Heart rate and rhythm are normal . No peripheral edema.   Abdomen: The abdomen is mildly obese. The abdomen is soft and nontender. No masses are palpated. No CVA tenderness. No hernias are palpable. No hepatosplenomegaly noted.   Rectal: Rectal exam demonstrates normal sphincter tone, no tenderness and no masses. Estimated prostate size is 1+. The prostate has no nodularity, is indurated involving the entire left lobe of the prostate and is not tender. The left seminal vesicle is nonpalpable. The right seminal vesicle is  nonpalpable. The perineum is normal on inspection.   Genitourinary: Examination of the penis demonstrates no discharge, no masses, no lesions and a normal meatus. The penis is circumcised. The scrotum is without lesions. The right epididymis is palpably normal and non-tender. The left epididymis is palpably normal and non-tender. The right testis is non-tender and without masses. The left testis is non-tender and without masses.   Lymphatics: The femoral and inguinal nodes are not enlarged or tender.   Skin: Normal skin turgor, no visible rash and no visible skin lesions.   Neuro/Psych:. Mood and affect are appropriate.    PAST DATA REVIEWED:  Source Of History:  Patient   10/13/17 04/21/17 10/08/16 04/22/16 10/15/15 04/23/15  PSA  Total PSA 8.38 ng/mL 6.90 ng/mL 5.82 ng/dl 6.13  6.97  1.72     PROCEDURES:          Urinalysis Dipstick Dipstick Cont'd Micro  Color: Yellow Bilirubin: Neg WBC/hpf: 0 - 5/hpf  Appearance: Clear Ketones: Neg RBC/hpf: 10 - 20/hpf  Specific Gravity: 1.020 Blood: 3+ Bacteria: NS (Not Seen)  pH: 6.5 Protein: Trace Cystals: NS (Not Seen)  Glucose: Neg Urobilinogen: 0.2 Casts: NS (Not Seen)    Nitrites: Neg Trichomonas: Not Present    Leukocyte Esterase: Neg Mucous: Not Present      Epithelial Cells: 0 - 5/hpf      Yeast: NS (Not Seen)      Sperm: Not Present    ASSESSMENT/PLAN:     ICD-10 Details  1 GU:   Prostate Cancer - I went over his pathology report with him as well as the significance of his Gleason score, number and location of cores positive and percent of cores positive. We then discussed his Partin table results in detail and the significance of these predictions as far as prognosis and need for further workup are concerned. I then again discussed with him the various options available including active surveillance and treatment for cure such as radiation and surgery. I did discuss radioactive seed implantation with him in detail including how the  procedure is performed, its risks and complications, the probability of success, the outpatient nature of the procedure as well as the anticipated postoperative course.  He understands and elected to proceed.

## 2018-03-13 ENCOUNTER — Encounter (HOSPITAL_BASED_OUTPATIENT_CLINIC_OR_DEPARTMENT_OTHER): Payer: Self-pay | Admitting: Anesthesiology

## 2018-03-13 ENCOUNTER — Ambulatory Visit (HOSPITAL_BASED_OUTPATIENT_CLINIC_OR_DEPARTMENT_OTHER): Payer: Medicare Other | Admitting: Anesthesiology

## 2018-03-13 ENCOUNTER — Ambulatory Visit (HOSPITAL_BASED_OUTPATIENT_CLINIC_OR_DEPARTMENT_OTHER)
Admission: RE | Admit: 2018-03-13 | Discharge: 2018-03-13 | Disposition: A | Discharge status: Home / Self Care | Payer: Medicare Other | Source: Ambulatory Visit | Attending: Urology | Admitting: Urology

## 2018-03-13 ENCOUNTER — Encounter (HOSPITAL_BASED_OUTPATIENT_CLINIC_OR_DEPARTMENT_OTHER)
Admission: RE | Disposition: A | Discharge status: Home / Self Care | Payer: Self-pay | Source: Ambulatory Visit | Attending: Urology

## 2018-03-13 ENCOUNTER — Ambulatory Visit (HOSPITAL_COMMUNITY): Payer: Medicare Other

## 2018-03-13 DIAGNOSIS — E785 Hyperlipidemia, unspecified: Secondary | ICD-10-CM | POA: Diagnosis not present

## 2018-03-13 DIAGNOSIS — M1990 Unspecified osteoarthritis, unspecified site: Secondary | ICD-10-CM | POA: Insufficient documentation

## 2018-03-13 DIAGNOSIS — N529 Male erectile dysfunction, unspecified: Secondary | ICD-10-CM | POA: Insufficient documentation

## 2018-03-13 DIAGNOSIS — Z7982 Long term (current) use of aspirin: Secondary | ICD-10-CM | POA: Insufficient documentation

## 2018-03-13 DIAGNOSIS — Z87442 Personal history of urinary calculi: Secondary | ICD-10-CM | POA: Diagnosis not present

## 2018-03-13 DIAGNOSIS — F419 Anxiety disorder, unspecified: Secondary | ICD-10-CM | POA: Insufficient documentation

## 2018-03-13 DIAGNOSIS — C61 Malignant neoplasm of prostate: Secondary | ICD-10-CM | POA: Insufficient documentation

## 2018-03-13 DIAGNOSIS — M109 Gout, unspecified: Secondary | ICD-10-CM | POA: Diagnosis not present

## 2018-03-13 DIAGNOSIS — Z79899 Other long term (current) drug therapy: Secondary | ICD-10-CM | POA: Insufficient documentation

## 2018-03-13 DIAGNOSIS — I10 Essential (primary) hypertension: Secondary | ICD-10-CM | POA: Diagnosis not present

## 2018-03-13 HISTORY — PX: SPACE OAR INSTILLATION: SHX6769

## 2018-03-13 HISTORY — DX: Other specified postprocedural states: Z98.890

## 2018-03-13 HISTORY — DX: Presence of spectacles and contact lenses: Z97.3

## 2018-03-13 HISTORY — DX: Frequency of micturition: R35.0

## 2018-03-13 HISTORY — PX: RADIOACTIVE SEED IMPLANT: SHX5150

## 2018-03-13 HISTORY — DX: Nocturia: R35.1

## 2018-03-13 LAB — POCT I-STAT, CHEM 8
BUN: 25 mg/dL — ABNORMAL HIGH (ref 8–23)
Calcium, Ion: 1.23 mmol/L (ref 1.15–1.40)
Chloride: 104 mmol/L (ref 98–111)
Creatinine, Ser: 1.1 mg/dL (ref 0.61–1.24)
Glucose, Bld: 118 mg/dL — ABNORMAL HIGH (ref 70–99)
HCT: 45 % (ref 39.0–52.0)
Hemoglobin: 15.3 g/dL (ref 13.0–17.0)
Potassium: 5.2 mmol/L — ABNORMAL HIGH (ref 3.5–5.1)
Sodium: 140 mmol/L (ref 135–145)
TCO2: 28 mmol/L (ref 22–32)

## 2018-03-13 SURGERY — INSERTION, RADIATION SOURCE, PROSTATE
Anesthesia: General | Site: Prostate

## 2018-03-13 MED ORDER — PROPOFOL 10 MG/ML IV BOLUS
INTRAVENOUS | Status: AC
  Filled 2018-03-13: qty 40

## 2018-03-13 MED ORDER — FENTANYL CITRATE (PF) 100 MCG/2ML IJ SOLN
INTRAMUSCULAR | Status: AC
  Filled 2018-03-13: qty 2

## 2018-03-13 MED ORDER — MIDAZOLAM HCL 2 MG/2ML IJ SOLN
INTRAMUSCULAR | Status: AC
  Filled 2018-03-13: qty 2

## 2018-03-13 MED ORDER — CIPROFLOXACIN HCL 500 MG PO TABS: 500.0000 mg | ORAL_TABLET | Freq: Two times a day (BID) | ORAL | 0 refills | Status: DC

## 2018-03-13 MED ORDER — ONDANSETRON HCL 4 MG/2ML IJ SOLN
INTRAMUSCULAR | Status: AC
  Filled 2018-03-13: qty 2

## 2018-03-13 MED ORDER — DEXAMETHASONE SODIUM PHOSPHATE 10 MG/ML IJ SOLN
INTRAMUSCULAR | Status: DC | PRN
  Administered 2018-03-13: 10 mg via INTRAVENOUS

## 2018-03-13 MED ORDER — STERILE WATER FOR IRRIGATION IR SOLN
Status: DC | PRN
  Administered 2018-03-13: 3 mL

## 2018-03-13 MED ORDER — OXYCODONE HCL 5 MG PO TABS
5.0000 mg | ORAL_TABLET | Freq: Once | ORAL | Status: DC | PRN
  Filled 2018-03-13: qty 1

## 2018-03-13 MED ORDER — PHENYLEPHRINE 40 MCG/ML (10ML) SYRINGE FOR IV PUSH (FOR BLOOD PRESSURE SUPPORT)
PREFILLED_SYRINGE | INTRAVENOUS | Status: AC
  Filled 2018-03-13: qty 10

## 2018-03-13 MED ORDER — DEXAMETHASONE SODIUM PHOSPHATE 10 MG/ML IJ SOLN
INTRAMUSCULAR | Status: AC
  Filled 2018-03-13: qty 1

## 2018-03-13 MED ORDER — FENTANYL CITRATE (PF) 100 MCG/2ML IJ SOLN
INTRAMUSCULAR | Status: DC | PRN
  Administered 2018-03-13 (×4): 25 ug via INTRAVENOUS

## 2018-03-13 MED ORDER — PROMETHAZINE HCL 25 MG/ML IJ SOLN
6.2500 mg | INTRAMUSCULAR | Status: DC | PRN
  Filled 2018-03-13: qty 1

## 2018-03-13 MED ORDER — PROPOFOL 10 MG/ML IV BOLUS
INTRAVENOUS | Status: DC | PRN
  Administered 2018-03-13: 200 mg via INTRAVENOUS

## 2018-03-13 MED ORDER — IOHEXOL 300 MG/ML  SOLN
INTRAMUSCULAR | Status: DC | PRN
  Administered 2018-03-13: 7 mL

## 2018-03-13 MED ORDER — FLEET ENEMA 7-19 GM/118ML RE ENEM
1.0000 | ENEMA | Freq: Once | RECTAL | Status: AC
  Administered 2018-03-13: 1 via RECTAL
  Filled 2018-03-13: qty 1

## 2018-03-13 MED ORDER — HYDROCODONE-ACETAMINOPHEN 10-325 MG PO TABS: 1.0000 | ORAL_TABLET | ORAL | 0 refills | Status: DC | PRN

## 2018-03-13 MED ORDER — LIDOCAINE 2% (20 MG/ML) 5 ML SYRINGE
INTRAMUSCULAR | Status: DC | PRN
  Administered 2018-03-13: 60 mg via INTRAVENOUS

## 2018-03-13 MED ORDER — ARTIFICIAL TEARS OPHTHALMIC OINT
TOPICAL_OINTMENT | OPHTHALMIC | Status: AC
  Filled 2018-03-13: qty 3.5

## 2018-03-13 MED ORDER — MEPERIDINE HCL 25 MG/ML IJ SOLN
6.2500 mg | INTRAMUSCULAR | Status: DC | PRN
  Filled 2018-03-13: qty 1

## 2018-03-13 MED ORDER — KETOROLAC TROMETHAMINE 30 MG/ML IJ SOLN
INTRAMUSCULAR | Status: AC
  Filled 2018-03-13: qty 1

## 2018-03-13 MED ORDER — LIDOCAINE 2% (20 MG/ML) 5 ML SYRINGE
INTRAMUSCULAR | Status: AC
  Filled 2018-03-13: qty 5

## 2018-03-13 MED ORDER — OXYCODONE HCL 5 MG/5ML PO SOLN
5.0000 mg | Freq: Once | ORAL | Status: DC | PRN
  Filled 2018-03-13: qty 5

## 2018-03-13 MED ORDER — LACTATED RINGERS IV SOLN
INTRAVENOUS | Status: DC
  Administered 2018-03-13: 1000 mL via INTRAVENOUS
  Administered 2018-03-13 (×2): via INTRAVENOUS
  Filled 2018-03-13: qty 1000

## 2018-03-13 MED ORDER — KETOROLAC TROMETHAMINE 30 MG/ML IJ SOLN
INTRAMUSCULAR | Status: DC | PRN
  Administered 2018-03-13: 30 mg via INTRAVENOUS

## 2018-03-13 MED ORDER — ONDANSETRON HCL 4 MG/2ML IJ SOLN
INTRAMUSCULAR | Status: DC | PRN
  Administered 2018-03-13: 4 mg via INTRAVENOUS

## 2018-03-13 MED ORDER — SODIUM CHLORIDE 0.9 % IV SOLN
INTRAVENOUS | Status: AC | PRN
  Administered 2018-03-13: 1000 mL

## 2018-03-13 MED ORDER — CIPROFLOXACIN IN D5W 400 MG/200ML IV SOLN
400.0000 mg | INTRAVENOUS | Status: AC
  Administered 2018-03-13: 400 mg via INTRAVENOUS
  Filled 2018-03-13: qty 200

## 2018-03-13 MED ORDER — CIPROFLOXACIN IN D5W 400 MG/200ML IV SOLN
INTRAVENOUS | Status: AC
Start: 2018-03-13 — End: ?
  Filled 2018-03-13: qty 200

## 2018-03-13 MED ORDER — MIDAZOLAM HCL 2 MG/2ML IJ SOLN
INTRAMUSCULAR | Status: DC | PRN
  Administered 2018-03-13: 2 mg via INTRAVENOUS

## 2018-03-13 MED ORDER — HYDROMORPHONE HCL 1 MG/ML IJ SOLN
0.2500 mg | INTRAMUSCULAR | Status: DC | PRN
  Filled 2018-03-13: qty 0.5

## 2018-03-13 MED FILL — CIPROFLOXACIN HCL 500 MG TA: 500 | 3 days supply | Qty: 6 | Fill #0

## 2018-03-13 MED FILL — HYDROCODON-APAP 10-325: 10-325 | 2 days supply | Qty: 10 | Fill #0

## 2018-03-13 SURGICAL SUPPLY — 37 items
BAG URINE DRAINAGE (UROLOGICAL SUPPLIES) ×2 IMPLANT
BLADE CLIPPER SURG (BLADE) ×2 IMPLANT
CATH FOLEY 2WAY SLVR  5CC 16FR (CATHETERS) ×1
CATH FOLEY 2WAY SLVR 5CC 16FR (CATHETERS) ×1 IMPLANT
CATH ROBINSON RED A/P 20FR (CATHETERS) ×2 IMPLANT
CLOTH BEACON ORANGE TIMEOUT ST (SAFETY) ×2 IMPLANT
CONT SPECI 4OZ STER CLIK (MISCELLANEOUS) ×4 IMPLANT
COVER BACK TABLE 60X90IN (DRAPES) ×2 IMPLANT
COVER MAYO STAND STRL (DRAPES) ×2 IMPLANT
DRSG TEGADERM 4X4.75 (GAUZE/BANDAGES/DRESSINGS) ×2 IMPLANT
DRSG TEGADERM 8X12 (GAUZE/BANDAGES/DRESSINGS) ×4 IMPLANT
GAUZE SPONGE 4X4 12PLY STRL LF (GAUZE/BANDAGES/DRESSINGS) ×2 IMPLANT
GLOVE BIO SURGEON STRL SZ 6 (GLOVE) ×2 IMPLANT
GLOVE BIO SURGEON STRL SZ7 (GLOVE) IMPLANT
GLOVE BIO SURGEON STRL SZ8 (GLOVE) ×6 IMPLANT
GLOVE BIOGEL PI IND STRL 6 (GLOVE) ×1 IMPLANT
GLOVE BIOGEL PI IND STRL 8 (GLOVE) ×2 IMPLANT
GLOVE BIOGEL PI INDICATOR 6 (GLOVE) ×1
GLOVE BIOGEL PI INDICATOR 8 (GLOVE) ×2
GLOVE ECLIPSE 8.0 STRL XLNG CF (GLOVE) ×2 IMPLANT
GLOVE INDICATOR 7.0 STRL GRN (GLOVE) ×2 IMPLANT
GOWN STRL REUS W/TWL LRG LVL3 (GOWN DISPOSABLE) ×6 IMPLANT
GOWN STRL REUS W/TWL XL LVL3 (GOWN DISPOSABLE) ×4 IMPLANT
HOLDER FOLEY CATH W/STRAP (MISCELLANEOUS) IMPLANT
IMPL SPACEOAR SYSTEM 10ML (MISCELLANEOUS) ×1 IMPLANT
IMPLANT SPACEOAR SYSTEM 10ML (MISCELLANEOUS) ×2
IV NS 1000ML (IV SOLUTION) ×1
IV NS 1000ML BAXH (IV SOLUTION) ×1 IMPLANT
KIT TURNOVER CYSTO (KITS) ×2 IMPLANT
MARKER SKIN DUAL TIP RULER LAB (MISCELLANEOUS) ×2 IMPLANT
PACK CYSTO (CUSTOM PROCEDURE TRAY) ×2 IMPLANT
SURGILUBE 2OZ TUBE FLIPTOP (MISCELLANEOUS) ×2 IMPLANT
SYR 10ML LL (SYRINGE) ×2 IMPLANT
TOWEL OR 17X24 6PK STRL BLUE (TOWEL DISPOSABLE) ×4 IMPLANT
UNDERPAD 30X30 (UNDERPADS AND DIAPERS) ×4 IMPLANT
WATER STERILE IRR 500ML POUR (IV SOLUTION) ×2 IMPLANT
radioactive seed ×170 IMPLANT

## 2018-03-13 NOTE — Anesthesia Procedure Notes (Signed)
Procedure Name: LMA Insertion Performed by: Wanita Chamberlain, CRNA Pre-anesthesia Checklist: Patient identified, Emergency Drugs available, Suction available, Patient being monitored and Timeout performed Patient Re-evaluated:Patient Re-evaluated prior to induction Oxygen Delivery Method: Circle system utilized Preoxygenation: Pre-oxygenation with 100% oxygen Induction Type: IV induction Ventilation: Mask ventilation without difficulty LMA: LMA inserted LMA Size: 4.0 Number of attempts: 1 Airway Equipment and Method: Bite block (gauze bite guard b/t LMA and central incisor crowns) Placement Confirmation: breath sounds checked- equal and bilateral,  CO2 detector and positive ETCO2 Tube secured with: Tape Dental Injury: Teeth and Oropharynx as per pre-operative assessment

## 2018-03-13 NOTE — Op Note (Signed)
PATIENT:  Christian Pope.  PRE-OPERATIVE DIAGNOSIS:  Adenocarcinoma of the prostate  POST-OPERATIVE DIAGNOSIS:  Same  PROCEDURE:  1. I-125 radioactive seed implantation 2. Cystoscopy  3. Placement of SpaceOAR  SURGEON:  Surgeon(s): Claybon Jabs  Radiation oncologist: Dr. Tyler Pita  ANESTHESIA:  General  EBL:  Minimal  DRAINS: None  INDICATION: Christian Pope. is a 72 year old male with biopsy-proven adenocarcinoma the prostate.  He was initially diagnosed in 9/17 with low risk prostate cancer and elected to proceed with active surveillance.  Due to a rising PSA a prostate MRI scan was obtained which revealed lesions within the prostate that were biopsied with fusion biopsy and have revealed grade progression with his Gleason score having increased from  3+ 3 to 3 +4 in 2 cores.  He therefore has elected to proceed with radioactive seed implant.  Description of procedure: After informed consent the patient was brought to the major OR, placed on the table and administered general anesthesia. He was then moved to the modified lithotomy position with his perineum perpendicular to the floor. His perineum and genitalia were then sterilely prepped. An official timeout was then performed. A 16 French Foley catheter was then placed in the bladder and filled with dilute contrast, a rectal tube was placed in the rectum and the transrectal ultrasound probe was placed in the rectum and affixed to the stand. He was then sterilely draped.  Real time ultrasonography was used along with the seed planning software [employer] Prostate vs. 4.2.2.4. This was used to develop the seed plan including the number of needles as well as number of seeds required for complete and adequate coverage.  The needles were then preloaded with seeds and spacers according to the previously developed plan.  Real-time ultrasonography was then used along with the previously developed plan to implant a total of 85 seeds  using 24 needles. This proceeded without difficulty or complication.   I then proceeded with placement of SpaceOAR by introducing a needle with the bevel angled inferiorly approximately 2 cm superior to the anus. This was angled downward and under direct ultrasound was placed within the space between the prostatic capsule and rectum. This was confirmed with a small amount of sterile saline injected and this was performed under direct ultrasound. I then attached the SpaceOAR to the needle and injected this in the space between the prostate and rectum with good placement noted.  A Foley catheter was then removed as well as the transrectal ultrasound probe and rectal probe. Flexible cystoscopy was then performed using the 17 French flexible scope which revealed a normal urethra throughout its length down to the sphincter which appeared intact. The prostatic urethra revealed bilobar hypertrophy but no evidence of obstruction, seeds, spacers or lesions. The bladder was then entered and fully and systematically inspected. The ureteral orifices were noted to be of normal configuration and position. The mucosa revealed no evidence of tumors. There were also no stones identified within the bladder. I noted no seeds or spacers on the floor of the bladder and retroflexion of the scope revealed no seeds protruding from the base of the prostate.  The cystoscope was then removed and the patient was awakened and taken to recovery room in stable and satisfactory condition. He tolerated procedure well and there were no intraoperative complications.

## 2018-03-13 NOTE — Anesthesia Preprocedure Evaluation (Addendum)
Anesthesia Evaluation  Patient identified by MRN, date of birth, ID band Patient awake    Reviewed: Allergy & Precautions, NPO status , Patient's Chart, lab work & pertinent test results  Airway Mallampati: II  TM Distance: >3 FB Neck ROM: Full    Dental no notable dental hx. (+) Teeth Intact, Dental Advisory Given, Caps,    Pulmonary neg pulmonary ROS, former smoker,    Pulmonary exam normal breath sounds clear to auscultation       Cardiovascular hypertension, Pt. on medications and Pt. on home beta blockers negative cardio ROS Normal cardiovascular exam Rhythm:Regular Rate:Normal  On a Statin- Zocor   Neuro/Psych Anxiety negative neurological ROS  negative psych ROS   GI/Hepatic negative GI ROS, Neg liver ROS,   Endo/Other  negative endocrine ROS  Renal/GU negative Renal ROSProstate CA  negative genitourinary   Musculoskeletal negative musculoskeletal ROS (+)   Abdominal (+) + obese,   Peds negative pediatric ROS (+)  Hematology negative hematology ROS (+)   Anesthesia Other Findings Hx Gout  Reproductive/Obstetrics negative OB ROS                                                            Anesthesia Evaluation  Patient identified by MRN, date of birth, ID band Patient awake    Reviewed: Allergy & Precautions, NPO status , Patient's Chart, lab work & pertinent test results  Airway Mallampati: III  TM Distance: <3 FB     Dental  (+) Caps, Chipped   Pulmonary former smoker,    Pulmonary exam normal        Cardiovascular hypertension, Normal cardiovascular exam     Neuro/Psych negative neurological ROS  negative psych ROS   GI/Hepatic negative GI ROS, Neg liver ROS,   Endo/Other  negative endocrine ROS  Renal/GU negative Renal ROS     Musculoskeletal negative musculoskeletal ROS (+)   Abdominal Normal abdominal exam  (+)   Peds negative pediatric  ROS (+)  Hematology negative hematology ROS (+)   Anesthesia Other Findings Past Medical History: No date: Hypertension No date: Prostate cancer ([facility]CC)  Reproductive/Obstetrics                            Anesthesia Physical Anesthesia Plan  ASA: II  Anesthesia Plan: General   Post-op Pain Management:    Induction: Intravenous  PONV Risk Score and Plan:   Airway Management Planned: Nasal Cannula  Additional Equipment:   Intra-op Plan:   Post-operative Plan:   Informed Consent: I have reviewed the patients History and Physical, chart, labs and discussed the procedure including the risks, benefits and alternatives for the proposed anesthesia with the patient or authorized representative who has indicated his/her understanding and acceptance.   Dental advisory given  Plan Discussed with: CRNA and Surgeon  Anesthesia Plan Comments:         Anesthesia Quick Evaluation  Anesthesia Physical Anesthesia Plan  ASA: II  Anesthesia Plan: General   Post-op Pain Management:    Induction: Intravenous  PONV Risk Score and Plan: 2 and Ondansetron and Midazolam  Airway Management Planned: LMA  Additional Equipment:   Intra-op Plan:   Post-operative Plan: Extubation in OR  Informed Consent: I have reviewed the patients History and Physical,  chart, labs and discussed the procedure including the risks, benefits and alternatives for the proposed anesthesia with the patient or authorized representative who has indicated his/her understanding and acceptance.   Dental advisory given  Plan Discussed with: CRNA  Anesthesia Plan Comments:         Anesthesia Quick Evaluation

## 2018-03-13 NOTE — Discharge Instructions (Signed)
DISCHARGE INSTRUCTIONS FOR PROSTATE SEED IMPLANTATION ° °Antibiotics °You may be given a prescription for an antibiotic to take when you arrive home. If so, be sure to take every tablet in the bottle, even if you are feeling better before the prescription is finished. If you begin itching, notice a rash or start to swell on your trunk, arms, legs and/or throat, immediately stop taking the antibiotic and call your Urologist. °Diet °Resume your usual diet when you return home. To keep your bowels moving easily and softly, drink prune, apple and cranberry juice at room temperature. You may also take a stool softener, such as Colace, which is available without prescription at local pharmacies. °Daily activities °? No driving or heavy lifting for at least two days after the implant. °? No bike riding, horseback riding or riding lawn mowers for the first month after the implant. °? Any strenuous physical activity should be approved by your doctor before you resume it. °Sexual relations °You may resume sexual relations two weeks after the procedure. A condom should be used for the first two weeks. Your semen may be dark brown or black; this is normal and is related bleeding that may have occurred during the implant. °Postoperative swelling °Expect swelling and bruising of the scrotum and perineum (the area between the scrotum and anus). Both the swelling and the bruising should resolve in l or 2 weeks. Ice packs and over- the-counter medications such as Tylenol, Advil or Aleve may lessen your discomfort. °Postoperative urination °Most men experience burning on urination and/or urinary frequency. If this becomes bothersome, contact your Urologist.  Medication can be prescribed to relieve these problems.  It is normal to have some blood in your urine for a few days after the implant. °Special instructions related to the seeds °It is unlikely that you will pass an Iodine-125 seed in your urine. The seeds are silver in color  and are about as large as a grain of rice. If you pass a seed, do not handle it with your fingers. Use a spoon to place it in an envelope or jar in place this in base occluded area such as the garage or basement for return to the radiation clinic at your convenience. ° °Contact your doctor for °? Temperature greater than 101 F °? Increasing pain °? Inability to urinate °Follow-up ° You should have follow up with your urologist and radiation oncologist about 3 weeks after the procedure. °General information regarding Iodine seeds °? Iodine-125 is a low energy radioactive material. It is not deeply penetrating and loses energy at short distances. Your prostate will absorb the radiation. Objects that are touched or used by the patient do not become radioactive. °? Body wastes (urine and stool) or body fluids (saliva, tears, semen or blood) are not radioactive. °? The Nuclear Regulatory Commission (NRC) has determined that no radiation precautions are needed for patients undergoing Iodine-125 seed implantation. The NRC states that such patients do not present a risk to the people around them, including young children and pregnant women. However, in keeping with the general principle that radiation exposure should be kept as low reasonably possible, we suggest the following: °? Children and pets should not sit on the patient's lap for the first two (2) weeks after the implant. °? Pregnant (or possibly pregnant) women should avoid prolonged, close contact with the patient for the first two (2) weeks after the implant. °? A distance of three (3) feet is acceptable. °? At a distance of three (3)   feet, there is no limit to the length of time anyone can be with the patient. ?    Post Anesthesia Home Care Instructions  Activity: Get plenty of rest for the remainder of the day. A responsible individual must stay with you for 24 hours following the procedure.  For the next 24 hours, DO NOT: -Drive a car -Conservation officer, nature -Drink alcoholic beverages -Take any medication unless instructed by your physician -Make any legal decisions or sign important papers.  Meals: Start with liquid foods such as gelatin or soup. Progress to regular foods as tolerated. Avoid greasy, spicy, heavy foods. If nausea and/or vomiting occur, drink only clear liquids until the nausea and/or vomiting subsides. Call your physician if vomiting continues.  Special Instructions/Symptoms: Your throat may feel dry or sore from the anesthesia or the breathing tube placed in your throat during surgery. If this causes discomfort, gargle with warm salt water. The discomfort should disappear within 24 hours.  No Advil, Motrin, Aleve, or Ibuprofen until 5 PM.

## 2018-03-13 NOTE — Transfer of Care (Signed)
Immediate Anesthesia Transfer of Care Note  Patient: Christian Hay Sr.  Procedure(s) Performed: RADIOACTIVE SEED IMPLANT/BRACHYTHERAPY IMPLANT (N/A Prostate) SPACE OAR INSTILLATION (N/A Prostate)  Patient Location: PACU  Anesthesia Type:General  Level of Consciousness: awake, alert , oriented and patient cooperative  Airway & Oxygen Therapy: Patient Spontanous Breathing and Patient connected to nasal cannula oxygen  Post-op Assessment: Report given to RN and Post -op Vital signs reviewed and stable  Post vital signs: Reviewed and stable  Last Vitals:  Vitals Value Taken Time  BP    Temp    Pulse    Resp    SpO2      Last Pain:  Vitals:   03/13/18 0746  TempSrc:   PainSc: 0-No pain      Patients Stated Pain Goal: 4 (17/98/10 2548)  Complications: No apparent anesthesia complications

## 2018-03-13 NOTE — Anesthesia Postprocedure Evaluation (Signed)
Anesthesia Post Note  Patient: Christian Hay Sr.  Procedure(s) Performed: RADIOACTIVE SEED IMPLANT/BRACHYTHERAPY IMPLANT (N/A Prostate) SPACE OAR INSTILLATION (N/A Prostate)     Patient location during evaluation: PACU Anesthesia Type: General Level of consciousness: awake and alert Pain management: pain level controlled Vital Signs Assessment: post-procedure vital signs reviewed and stable Respiratory status: spontaneous breathing, nonlabored ventilation and respiratory function stable Cardiovascular status: blood pressure returned to baseline and stable Postop Assessment: no apparent nausea or vomiting Anesthetic complications: no    Last Vitals:  Vitals:   03/13/18 1130 03/13/18 1145  BP: 130/67 126/61  Pulse: 78 76  Resp: 10 12  Temp:    SpO2: 98% 95%    Last Pain:  Vitals:   03/13/18 1212  TempSrc:   PainSc: 3                  Lynda Rainwater

## 2018-03-14 ENCOUNTER — Encounter (HOSPITAL_BASED_OUTPATIENT_CLINIC_OR_DEPARTMENT_OTHER): Payer: Self-pay | Admitting: Urology

## 2018-03-20 ENCOUNTER — Telehealth: Payer: Self-pay | Admitting: *Deleted

## 2018-03-20 NOTE — Telephone Encounter (Signed)
CALLED PATIENT TO INFORM OF MRI FOR 03-25-18 - ARRIVAL TIME - 12:30 PM @ [facility], NO RESTRICTIONS TO TEST, SPOKE WITH PATIENT AND HE IS AWARE OF THIS TEST

## 2018-03-23 ENCOUNTER — Telehealth: Payer: Self-pay | Admitting: *Deleted

## 2018-03-23 NOTE — Telephone Encounter (Signed)
CALLED PATIENT TO REMIND OF POST SEED APPTS. FOR 03-24-18, AND MRI FOR 03-25-18, SPOKE WITH PATIENT AND HE IS AWARE OF THESE APPTS.

## 2018-03-24 ENCOUNTER — Ambulatory Visit
Admission: RE | Admit: 2018-03-24 | Discharge: 2018-03-24 | Disposition: A | Discharge status: Home / Self Care | Payer: Medicare Other | Source: Ambulatory Visit | Attending: Radiation Oncology | Admitting: Radiation Oncology

## 2018-03-24 ENCOUNTER — Other Ambulatory Visit: Payer: Self-pay

## 2018-03-24 ENCOUNTER — Encounter: Payer: Self-pay | Admitting: Medical Oncology

## 2018-03-24 ENCOUNTER — Encounter: Payer: Self-pay | Admitting: Radiation Oncology

## 2018-03-24 VITALS — BP 132/82 | HR 74 | Temp 98.3°F | Resp 18 | Wt 225.8 lb

## 2018-03-24 DIAGNOSIS — Z7982 Long term (current) use of aspirin: Secondary | ICD-10-CM | POA: Insufficient documentation

## 2018-03-24 DIAGNOSIS — C61 Malignant neoplasm of prostate: Secondary | ICD-10-CM | POA: Diagnosis present

## 2018-03-24 DIAGNOSIS — Z923 Personal history of irradiation: Secondary | ICD-10-CM | POA: Insufficient documentation

## 2018-03-24 DIAGNOSIS — Z79899 Other long term (current) drug therapy: Secondary | ICD-10-CM | POA: Diagnosis not present

## 2018-03-24 NOTE — Progress Notes (Signed)
Received patient in the clinic for post seed follow up. Pre Seed IPSS 8. Post seed IPSS 18. Patient scheduled for MRI tomorrow at 1230 to confirm SpaceOar placement. Reports mild dysuria at the start of urination. Reports brown discharge at the start of urination as well. Denies urinary leakage or incontinence. Denies any bowel complaints. Scheduled to follow up with urologist on 04/03/2018.  BP 132/82 (BP Location: Left Arm)   Pulse 74   Temp 98.3 F (36.8 C)   Resp 18   Wt 225 lb 12.8 oz (102.4 kg)   SpO2 96%   BMI 35.37 kg/m  Wt Readings from Last 3 Encounters:  03/24/18 225 lb 12.8 oz (102.4 kg)  03/13/18 224 lb 3.2 oz (101.7 kg)  12/14/17 227 lb 6.4 oz (103.1 kg)

## 2018-03-24 NOTE — Progress Notes (Signed)
  Radiation Oncology         [phone]) [phone] ________________________________  Name: Christian Hay Sr. MRN: [id number]  Date: 03/24/2018  DOB: 24-Nov-1945  COMPLEX SIMULATION NOTE  NARRATIVE:  The patient was brought to the [facility] today following prostate seed implantation approximately one month ago.  Identity was confirmed.  All relevant records and images related to the planned course of therapy were reviewed.  Then, the patient was set-up supine.  CT images were obtained.  The CT images were loaded into the planning software.  Then the prostate and rectum were contoured.  Treatment planning then occurred.  The implanted iodine 125 seeds were identified by the physics staff for projection of radiation distribution  I have requested : 3D Simulation  I have requested a DVH of the following structures: Prostate and rectum.    ________________________________  Sheral Apley Tammi Klippel, M.D.  This document serves as a record of services personally performed by Tyler Pita, MD. It was created on his behalf by Arlyce Harman, a trained medical scribe. The creation of this record is based on the scribe's personal observations and the provider's statements to them. This document has been checked and approved by the attending provider.

## 2018-03-24 NOTE — Progress Notes (Signed)
Radiation Oncology         [phone]) [phone] ________________________________  Name: Christian Hay Sr. MRN: [id number]  Date: 03/24/2018  DOB: Jun 02, 1946  Follow-Up Visit Note  CC: Rusty Aus, MD  Kathie Rhodes, MD  Diagnosis:   72 y.o. male with Stage T1c adenocarcinoma of the prostate with Gleason Score of 3+4, and PSA of 8.38     ICD-10-CM   1. Malignant neoplasm of prostate (HCC) C61     Interval Since Last Radiation:  11 days  03/13/2018: Insertion of radioactive I-125 seeds into the prostate gland; 145 Gy, definitive therapy with SpaceOAR placement.  Narrative:  The patient returns today for routine follow-up.  He is complaining of increased urinary frequency and urinary hesitation symptoms. He filled out a questionnaire regarding urinary function today providing and overall IPSS score of 18 characterizing his symptoms as moderate with increased urgency, frequency, weak stream, intermittency and nocturia x2.  He does feel like he is generally emptying his bladder well on voiding.  He specifically denies gross hematuria, fever, chills or incontinence.  He has mild dysuria at the start of his stream.  His pre-implant score was 8. He denies any bowel symptoms.  He has a follow up visit at [facility] on 04/03/18.  ALLERGIES:  has No Known Allergies.  Meds: Current Outpatient Medications  Medication Sig Dispense Refill  . allopurinol (ZYLOPRIM) 300 MG tablet Take 300 mg by mouth every evening.     Marland Kitchen ALPRAZolam (XANAX) 0.25 MG tablet Take 0.25 mg by mouth 2 (two) times daily as needed for anxiety.     Marland Kitchen aspirin EC 81 MG tablet Take 81 mg by mouth daily. Stopped Sunday. 7/28    . Cholecalciferol (VITAMIN D-1000 MAX ST) 1000 units tablet Take by mouth daily.     . clotrimazole-betamethasone (LOTRISONE) cream Apply topically daily as needed.     . colchicine 0.6 MG tablet Take 2 tablets (1.2mg ) by mouth at first sign of gout flare followed by 1 tablet (0.6mg ) after 1 hour. (Max 1.8mg   within 1 hour)    . docusate sodium (COLACE) 100 MG capsule Take 100 mg by mouth daily.    Marland Kitchen lisinopril-hydrochlorothiazide (PRINZIDE,ZESTORETIC) 20-12.5 MG tablet TAKE 1 TABLET BY MOUTH ONCE A DAY    . magnesium oxide (MAG-OX) 400 MG tablet Take 500 mg by mouth daily. Stopped week ago    . sildenafil (REVATIO) 20 MG tablet Take by mouth daily as needed.     . simvastatin (ZOCOR) 20 MG tablet Take 20 mg by mouth.     Marland Kitchen HYDROcodone-acetaminophen (NORCO) 10-325 MG tablet Take 1-2 tablets by mouth every 4 (four) hours as needed for moderate pain. Maximum dose per 24 hours - 8 pills (Patient not taking: Reported on 03/24/2018) 10 tablet 0   No current facility-administered medications for this encounter.     Physical Findings: The patient is in no acute distress. Patient is alert and oriented.  weight is 225 lb 12.8 oz (102.4 kg). His temperature is 98.3 F (36.8 C). His blood pressure is 132/82 and his pulse is 74. His respiration is 18 and oxygen saturation is 96%. .  No significant changes.  Lab Findings: Lab Results  Component Value Date   WBC 9.0 03/06/2018   HGB 15.3 03/13/2018   HCT 45.0 03/13/2018   MCV 92.5 03/06/2018   PLT 242 03/06/2018    Radiographic Findings:  Patient underwent CT imaging in our clinic for post implant dosimetry. Dr. Tammi Klippel has personally  reviewed his CT images which appear to demonstrate an adequate distribution of radioactive seeds throughout the prostate gland. There no seeds in or near the rectum.  He is scheduled for an MRI prostate on 03/25/2018 and these images will be fused with his CT images for further evaluation.  We suspect the final radiation plan and dosimetry will show appropriate coverage of the prostate gland.   Impression: The patient is recovering from the effects of radiation. His urinary symptoms should gradually improve over the next 4-6 months. We talked about this today. He is encouraged by his improvement already and is otherwise pleased  with his outcome.   Plan: Today, I spent time talking to the patient about his prostate seed implant and resolving urinary symptoms. We also talked about long-term follow-up for prostate cancer following seed implant. He understands that ongoing PSA determinations and digital rectal exams will help perform surveillance to rule out disease recurrence. He understands what to expect with his PSA measures. Patient was also educated today about some of the long-term effects from radiation including a small risk for rectal bleeding and possibly erectile dysfunction. We talked about some of the general management approaches to these potential complications. However, I did encourage the patient to contact our office or return at any point if he has questions or concerns related to his previous radiation and prostate cancer.  _____________________________________   [name], [name]    Tyler Pita, MD  [address]: [phone]  Fax: [phone] [email or web address]  Skype  [email or web address]   This document serves as a record of services personally performed by Tyler Pita, MD. It was created on his behalf by Arlyce Harman, a trained medical scribe. The creation of this record is based on the scribe's personal observations and the provider's statements to them. This document has been checked and approved by the attending provider.

## 2018-03-25 ENCOUNTER — Ambulatory Visit (HOSPITAL_COMMUNITY)
Admission: RE | Admit: 2018-03-25 | Discharge: 2018-03-25 | Disposition: A | Discharge status: Home / Self Care | Payer: Medicare Other | Source: Ambulatory Visit | Attending: Urology | Admitting: Urology

## 2018-03-25 DIAGNOSIS — C61 Malignant neoplasm of prostate: Secondary | ICD-10-CM | POA: Insufficient documentation

## 2018-04-19 ENCOUNTER — Encounter: Payer: Self-pay | Admitting: Radiation Oncology

## 2018-04-28 NOTE — Progress Notes (Signed)
  Radiation Oncology         [phone]) [phone] ________________________________  Name: Christian Hay Sr. MRN: [id number]  Date: 04/19/2018  DOB: 09-13-1945  3D Planning Note   Prostate Brachytherapy Post-Implant Dosimetry  Diagnosis: 72 y.o. male with Stage T1c adenocarcinoma of the prostate with Gleason Score of 3+4, and PSA of 8.38   Narrative: On a previous date, [name] returned following prostate seed implantation for post implant planning. He underwent CT scan complex simulation to delineate the three-dimensional structures of the pelvis and demonstrate the radiation distribution.  Since that time, the seed localization, and complex isodose planning with dose volume histograms have now been completed.  Results:   Prostate Coverage - The dose of radiation delivered to the 90% or more of the prostate gland (D90) was 116.94% of the prescription dose. This exceeds our goal of greater than 90%. Rectal Sparing - The volume of rectal tissue receiving the prescription dose or higher was 0.0 cc. This falls under our thresholds tolerance of 1.0 cc.  Impression: The prostate seed implant appears to show adequate target coverage and appropriate rectal sparing.  Plan:  The patient will continue to follow with urology for ongoing PSA determinations. I would anticipate a high likelihood for local tumor control with minimal risk for rectal morbidity.  ________________________________  Sheral Apley Tammi Klippel, M.D.

## 2018-08-29 ENCOUNTER — Encounter: Payer: Self-pay | Admitting: *Deleted

## 2018-08-30 ENCOUNTER — Ambulatory Visit
Admission: RE | Admit: 2018-08-30 | Discharge: 2018-08-30 | Disposition: A | Discharge status: Home / Self Care | Payer: Medicare Other | Source: Ambulatory Visit | Attending: Internal Medicine | Admitting: Internal Medicine

## 2018-08-30 ENCOUNTER — Ambulatory Visit: Payer: Medicare Other | Admitting: Anesthesiology

## 2018-08-30 ENCOUNTER — Encounter
Admission: RE | Disposition: A | Discharge status: Home / Self Care | Payer: Self-pay | Source: Ambulatory Visit | Attending: Internal Medicine

## 2018-08-30 DIAGNOSIS — E785 Hyperlipidemia, unspecified: Secondary | ICD-10-CM | POA: Diagnosis not present

## 2018-08-30 DIAGNOSIS — Z87891 Personal history of nicotine dependence: Secondary | ICD-10-CM | POA: Insufficient documentation

## 2018-08-30 DIAGNOSIS — Z8546 Personal history of malignant neoplasm of prostate: Secondary | ICD-10-CM | POA: Insufficient documentation

## 2018-08-30 DIAGNOSIS — D123 Benign neoplasm of transverse colon: Secondary | ICD-10-CM | POA: Diagnosis not present

## 2018-08-30 DIAGNOSIS — K5730 Diverticulosis of large intestine without perforation or abscess without bleeding: Secondary | ICD-10-CM | POA: Insufficient documentation

## 2018-08-30 DIAGNOSIS — F419 Anxiety disorder, unspecified: Secondary | ICD-10-CM | POA: Insufficient documentation

## 2018-08-30 DIAGNOSIS — Z86010 Personal history of colonic polyps: Secondary | ICD-10-CM | POA: Diagnosis not present

## 2018-08-30 DIAGNOSIS — I10 Essential (primary) hypertension: Secondary | ICD-10-CM | POA: Insufficient documentation

## 2018-08-30 DIAGNOSIS — K921 Melena: Secondary | ICD-10-CM | POA: Insufficient documentation

## 2018-08-30 DIAGNOSIS — K641 Second degree hemorrhoids: Secondary | ICD-10-CM | POA: Insufficient documentation

## 2018-08-30 DIAGNOSIS — Z79899 Other long term (current) drug therapy: Secondary | ICD-10-CM | POA: Insufficient documentation

## 2018-08-30 DIAGNOSIS — M109 Gout, unspecified: Secondary | ICD-10-CM | POA: Insufficient documentation

## 2018-08-30 DIAGNOSIS — K625 Hemorrhage of anus and rectum: Secondary | ICD-10-CM | POA: Diagnosis present

## 2018-08-30 HISTORY — PX: COLONOSCOPY WITH PROPOFOL: SHX5780

## 2018-08-30 HISTORY — DX: Gout, unspecified: M10.9

## 2018-08-30 HISTORY — DX: Other cervical disc degeneration, unspecified cervical region: M50.30

## 2018-08-30 HISTORY — DX: Other premature depolarization: I49.49

## 2018-08-30 HISTORY — DX: Calculus of kidney: N20.0

## 2018-08-30 HISTORY — DX: Hyperlipidemia, unspecified: E78.5

## 2018-08-30 SURGERY — COLONOSCOPY WITH PROPOFOL
Anesthesia: General

## 2018-08-30 MED ORDER — PROPOFOL 10 MG/ML IV BOLUS
INTRAVENOUS | Status: DC | PRN
  Administered 2018-08-30: 20 mg via INTRAVENOUS
  Administered 2018-08-30: 40 mg via INTRAVENOUS
  Administered 2018-08-30: 50 mg via INTRAVENOUS

## 2018-08-30 MED ORDER — PROPOFOL 500 MG/50ML IV EMUL
INTRAVENOUS | Status: AC
  Filled 2018-08-30: qty 50

## 2018-08-30 MED ORDER — SODIUM CHLORIDE 0.9 % IV SOLN
INTRAVENOUS | Status: DC
  Administered 2018-08-30 (×2): via INTRAVENOUS

## 2018-08-30 MED ORDER — PROPOFOL 500 MG/50ML IV EMUL
INTRAVENOUS | Status: DC | PRN
  Administered 2018-08-30: 80 ug/kg/min via INTRAVENOUS

## 2018-08-30 NOTE — Anesthesia Preprocedure Evaluation (Signed)
Anesthesia Evaluation  Patient identified by MRN, date of birth, ID band Patient awake    Reviewed: Allergy & Precautions, NPO status , Patient's Chart, lab work & pertinent test results, reviewed documented beta blocker date and time   Airway Mallampati: III  TM Distance: >3 FB     Dental  (+) Chipped   Pulmonary former smoker,           Cardiovascular hypertension, Pt. on medications      Neuro/Psych Anxiety    GI/Hepatic   Endo/Other    Renal/GU Renal disease     Musculoskeletal  (+) Arthritis ,   Abdominal   Peds  Hematology   Anesthesia Other Findings Obese. Gout.  Reproductive/Obstetrics                             Anesthesia Physical Anesthesia Plan  ASA: III  Anesthesia Plan: General   Post-op Pain Management:    Induction: Intravenous  PONV Risk Score and Plan:   Airway Management Planned:   Additional Equipment:   Intra-op Plan:   Post-operative Plan:   Informed Consent: I have reviewed the patients History and Physical, chart, labs and discussed the procedure including the risks, benefits and alternatives for the proposed anesthesia with the patient or authorized representative who has indicated his/her understanding and acceptance.       Plan Discussed with: CRNA  Anesthesia Plan Comments:         Anesthesia Quick Evaluation

## 2018-08-30 NOTE — Anesthesia Post-op Follow-up Note (Signed)
Anesthesia QCDR form completed.        

## 2018-08-30 NOTE — Interval H&P Note (Signed)
History and Physical Interval Note:  08/30/2018 9:10 AM  Christian Leron Croak Sr.  has presented today for surgery, with the diagnosis of RECTAL BLEED HX PROSTATE  The various methods of treatment have been discussed with the patient and family. After consideration of risks, benefits and other options for treatment, the patient has consented to  Procedure(s): COLONOSCOPY WITH PROPOFOL (N/A) as a surgical intervention .  The patient's history has been reviewed, patient examined, no change in status, stable for surgery.  I have reviewed the patient's chart and labs.  Questions were answered to the patient's satisfaction.     [place], [place]

## 2018-08-30 NOTE — H&P (Signed)
Outpatient short stay form Pre-procedure 08/30/2018 9:08 AM Christian Pope, M.D.  Primary Physician: Emily Filbert, M.D.  Reason for visit:  Rectal bleeding, personal hx of prostate cancer, personal hx of colon polyps.  History of present illness:  73 y/o male with hx of prostate cancer s/p seed implantation with brachytherapy. Has intermittent BRBPR and recent passing of "Q-tip" like material.  Had small polyps removed on last colonoscopy in 08/2017.     Current Facility-Administered Medications:  .  0.9 %  sodium chloride infusion, , Intravenous, Continuous, Robertson, Benay Pike, MD, Last Rate: 20 mL/hr at 08/30/18 0820  Medications Prior to Admission  Medication Sig Dispense Refill Last Dose  . alfuzosin (UROXATRAL) 10 MG 24 hr tablet Take 10 mg by mouth daily. Take with dinner   08/29/2018  . ALPRAZolam (XANAX) 0.25 MG tablet Take 0.25 mg by mouth 2 (two) times daily as needed for anxiety.    Past Month at Unknown time  . clotrimazole-betamethasone (LOTRISONE) cream Apply topically daily as needed.    Past Month at Unknown time  . sildenafil (REVATIO) 20 MG tablet Take by mouth daily as needed.    Past Month at Unknown time  . allopurinol (ZYLOPRIM) 300 MG tablet Take 300 mg by mouth every evening.    08/27/2018  . aspirin EC 81 MG tablet Take 81 mg by mouth daily. Stopped Sunday. 7/28   08/27/2018  . Cholecalciferol (VITAMIN D-1000 MAX ST) 1000 units tablet Take by mouth daily.    08/27/2018  . colchicine 0.6 MG tablet Take 2 tablets (1.2mg ) by mouth at first sign of gout flare followed by 1 tablet (0.6mg ) after 1 hour. (Max 1.8mg  within 1 hour)   Taking  . docusate sodium (COLACE) 100 MG capsule Take 100 mg by mouth daily.   08/27/2018  . HYDROcodone-acetaminophen (NORCO) 10-325 MG tablet Take 1-2 tablets by mouth every 4 (four) hours as needed for moderate pain. Maximum dose per 24 hours - 8 pills (Patient not taking: Reported on 03/24/2018) 10 tablet 0 Not Taking  .  lisinopril-hydrochlorothiazide (PRINZIDE,ZESTORETIC) 20-12.5 MG tablet TAKE 1 TABLET BY MOUTH ONCE A DAY   08/29/2018  . magnesium oxide (MAG-OX) 400 MG tablet Take 500 mg by mouth daily. Stopped week ago   08/27/2018  . simvastatin (ZOCOR) 20 MG tablet Take 20 mg by mouth.    08/27/2018     No Known Allergies   Past Medical History:  Diagnosis Date  . Anxiety    history of anxiety/tx with xanax  . Degenerative disc disease, cervical     C5-6  . Ectopic beats   . Gout   . History of prostate biopsy 2017 & 2019  . Hyperlipidemia   . Hypertension   . Nephrolithiasis   . Nocturia   . Prostate cancer ([place])   . Urinary frequency   . Wears glasses     Review of systems:  Otherwise negative.    Physical Exam  Gen: Alert, oriented. Appears stated age.  HEENT: [place]/AT. PERRLA. Lungs: CTA, no wheezes. CV: RR nl S1, S2. Abd: soft, benign, no masses. BS+ Ext: No edema. Pulses 2+    Planned procedures: Proceed with colonoscopy. The patient understands the nature of the planned procedure, indications, risks, alternatives and potential complications including but not limited to bleeding, infection, perforation, damage to internal organs and possible oversedation/side effects from anesthesia. The patient agrees and gives consent to proceed.  Please refer to procedure notes for findings, recommendations and patient disposition/instructions.  Christian Pope, M.D. Gastroenterology 08/30/2018  9:08 AM

## 2018-08-30 NOTE — Transfer of Care (Signed)
Immediate Anesthesia Transfer of Care Note  Patient: Christian Hay Sr.  Procedure(s) Performed: COLONOSCOPY WITH PROPOFOL (N/A )  Patient Location: PACU  Anesthesia Type:General  Level of Consciousness: awake  Airway & Oxygen Therapy: Patient Spontanous Breathing  Post-op Assessment: Report given to RN  Post vital signs: stable  Last Vitals:  Vitals Value Taken Time  BP 95/50 08/30/2018  9:39 AM  Temp    Pulse 78 08/30/2018  9:40 AM  Resp 17 08/30/2018  9:40 AM  SpO2 99 % 08/30/2018  9:40 AM  Vitals shown include unvalidated device data.  Last Pain:  Vitals:   08/30/18 0803  TempSrc: Tympanic  PainSc: 0-No pain         Complications: No apparent anesthesia complications

## 2018-08-30 NOTE — Anesthesia Postprocedure Evaluation (Signed)
Anesthesia Post Note  Patient: Christian NORMOYLE Sr.  Procedure(s) Performed: COLONOSCOPY WITH PROPOFOL (N/A )  Patient location during evaluation: Endoscopy Anesthesia Type: General Level of consciousness: awake and alert Pain management: pain level controlled Vital Signs Assessment: post-procedure vital signs reviewed and stable Respiratory status: spontaneous breathing, nonlabored ventilation, respiratory function stable and patient connected to nasal cannula oxygen Cardiovascular status: blood pressure returned to baseline and stable Postop Assessment: no apparent nausea or vomiting Anesthetic complications: no     Last Vitals:  Vitals:   08/30/18 0930 08/30/18 0941  BP: (!) 95/50 (!) 95/50  Pulse: 76 78  Resp: (!) 22 17  Temp:  36.7 C  SpO2: 99% 99%    Last Pain:  Vitals:   08/30/18 0803  TempSrc: Tympanic  PainSc: 0-No pain                 Sayda Grable S

## 2018-08-30 NOTE — Op Note (Signed)
[facility] Gastroenterology Patient Name: Christian Pope Procedure Date: 08/30/2018 9:17 AM MRN: [id number] Account #: [id number] Date of Birth: 08-Apr-1946 Admit Type: Outpatient Age: 73 Room: [facility] ENDO ROOM 3 Gender: Male Note Status: Finalized Procedure:            Colonoscopy Indications:          Hematochezia Providers:            Benay Pike. Alice Reichert MD, MD Referring MD:         Rusty Aus, MD (Referring MD) Medicines:            Propofol per Anesthesia Complications:        No immediate complications. Procedure:            Pre-Anesthesia Assessment:                       - The risks and benefits of the procedure and the                        sedation options and risks were discussed with the                        patient. All questions were answered and informed                        consent was obtained.                       - Patient identification and proposed procedure were                        verified prior to the procedure by the nurse. The                        procedure was verified in the procedure room.                       - ASA Grade Assessment: III - A patient with severe                        systemic disease.                       - After reviewing the risks and benefits, the patient                        was deemed in satisfactory condition to undergo the                        procedure.                       After obtaining informed consent, the colonoscope was                        passed under direct vision. Throughout the procedure,                        the patient's blood pressure, pulse, and oxygen  saturations were monitored continuously. The                        Colonoscope was introduced through the anus and                        advanced to the the cecum, identified by appendiceal                        orifice and ileocecal valve. The colonoscopy was                        performed without  difficulty. The patient tolerated the                        procedure well. The quality of the bowel preparation                        was excellent. The ileocecal valve, appendiceal                        orifice, and rectum were photographed. Findings:      The perianal and digital rectal examinations were normal. Pertinent       negatives include normal sphincter tone and no palpable rectal lesions.      A few small-mouthed diverticula were found in the sigmoid colon.      A 4 mm polyp was found in the transverse colon. The polyp was sessile.       The polyp was removed with a jumbo cold forceps. Resection and retrieval       were complete.      Non-bleeding internal hemorrhoids were found during retroflexion. The       hemorrhoids were Grade II (internal hemorrhoids that prolapse but reduce       spontaneously).      There is no endoscopic evidence of bleeding, inflammation,       angiodysplasia or angioectasia in the entire colon. Impression:           - Diverticulosis in the sigmoid colon.                       - One 4 mm polyp in the transverse colon, removed with                        a jumbo cold forceps. Resected and retrieved.                       - Non-bleeding internal hemorrhoids. Recommendation:       - Patient has a contact number available for                        emergencies. The signs and symptoms of potential                        delayed complications were discussed with the patient.                        Return to normal activities tomorrow. Written discharge  instructions were provided to the patient.                       - Resume previous diet.                       - Continue present medications.                       - Miralax 1 capful (17 grams) in 8 ounces of water PO                        daily.                       - Repeat colonoscopy in 5 years for surveillance.                       - Return to physician assistant in 2  months.                       - The findings and recommendations were discussed with                        the patient and their spouse. Procedure Code(s):    --- Professional ---                       [id number], Colonoscopy, flexible; with biopsy, single or                        multiple Diagnosis Code(s):    --- Professional ---                       K57.30, Diverticulosis of large intestine without                        perforation or abscess without bleeding                       K92.1, Melena (includes Hematochezia)                       D12.3, Benign neoplasm of transverse colon (hepatic                        flexure or splenic flexure)                       K64.1, Second degree hemorrhoids CPT copyright 2018 American Medical Association. All rights reserved. The codes documented in this report are preliminary and upon coder review may  be revised to meet current compliance requirements. Efrain Sella MD, MD 08/30/2018 9:39:35 AM This report has been signed electronically. Number of Addenda: 0 Note Initiated On: 08/30/2018 9:17 AM Scope Withdrawal Time: 0 hours 7 minutes 30 seconds  Total Procedure Duration: 0 hours 12 minutes 2 seconds       [facility]

## 2018-08-31 ENCOUNTER — Encounter: Payer: Self-pay | Admitting: Internal Medicine

## 2018-08-31 LAB — SURGICAL PATHOLOGY

## 2019-03-04 IMAGING — DX DG CHEST 2V
2 series · 2 of 2 positions shown · non-contrast
Comparison: 12/14/2007 chest CT

CLINICAL DATA: Prostate cancer.

EXAM:
CHEST - 2 VIEW

[chest pa]
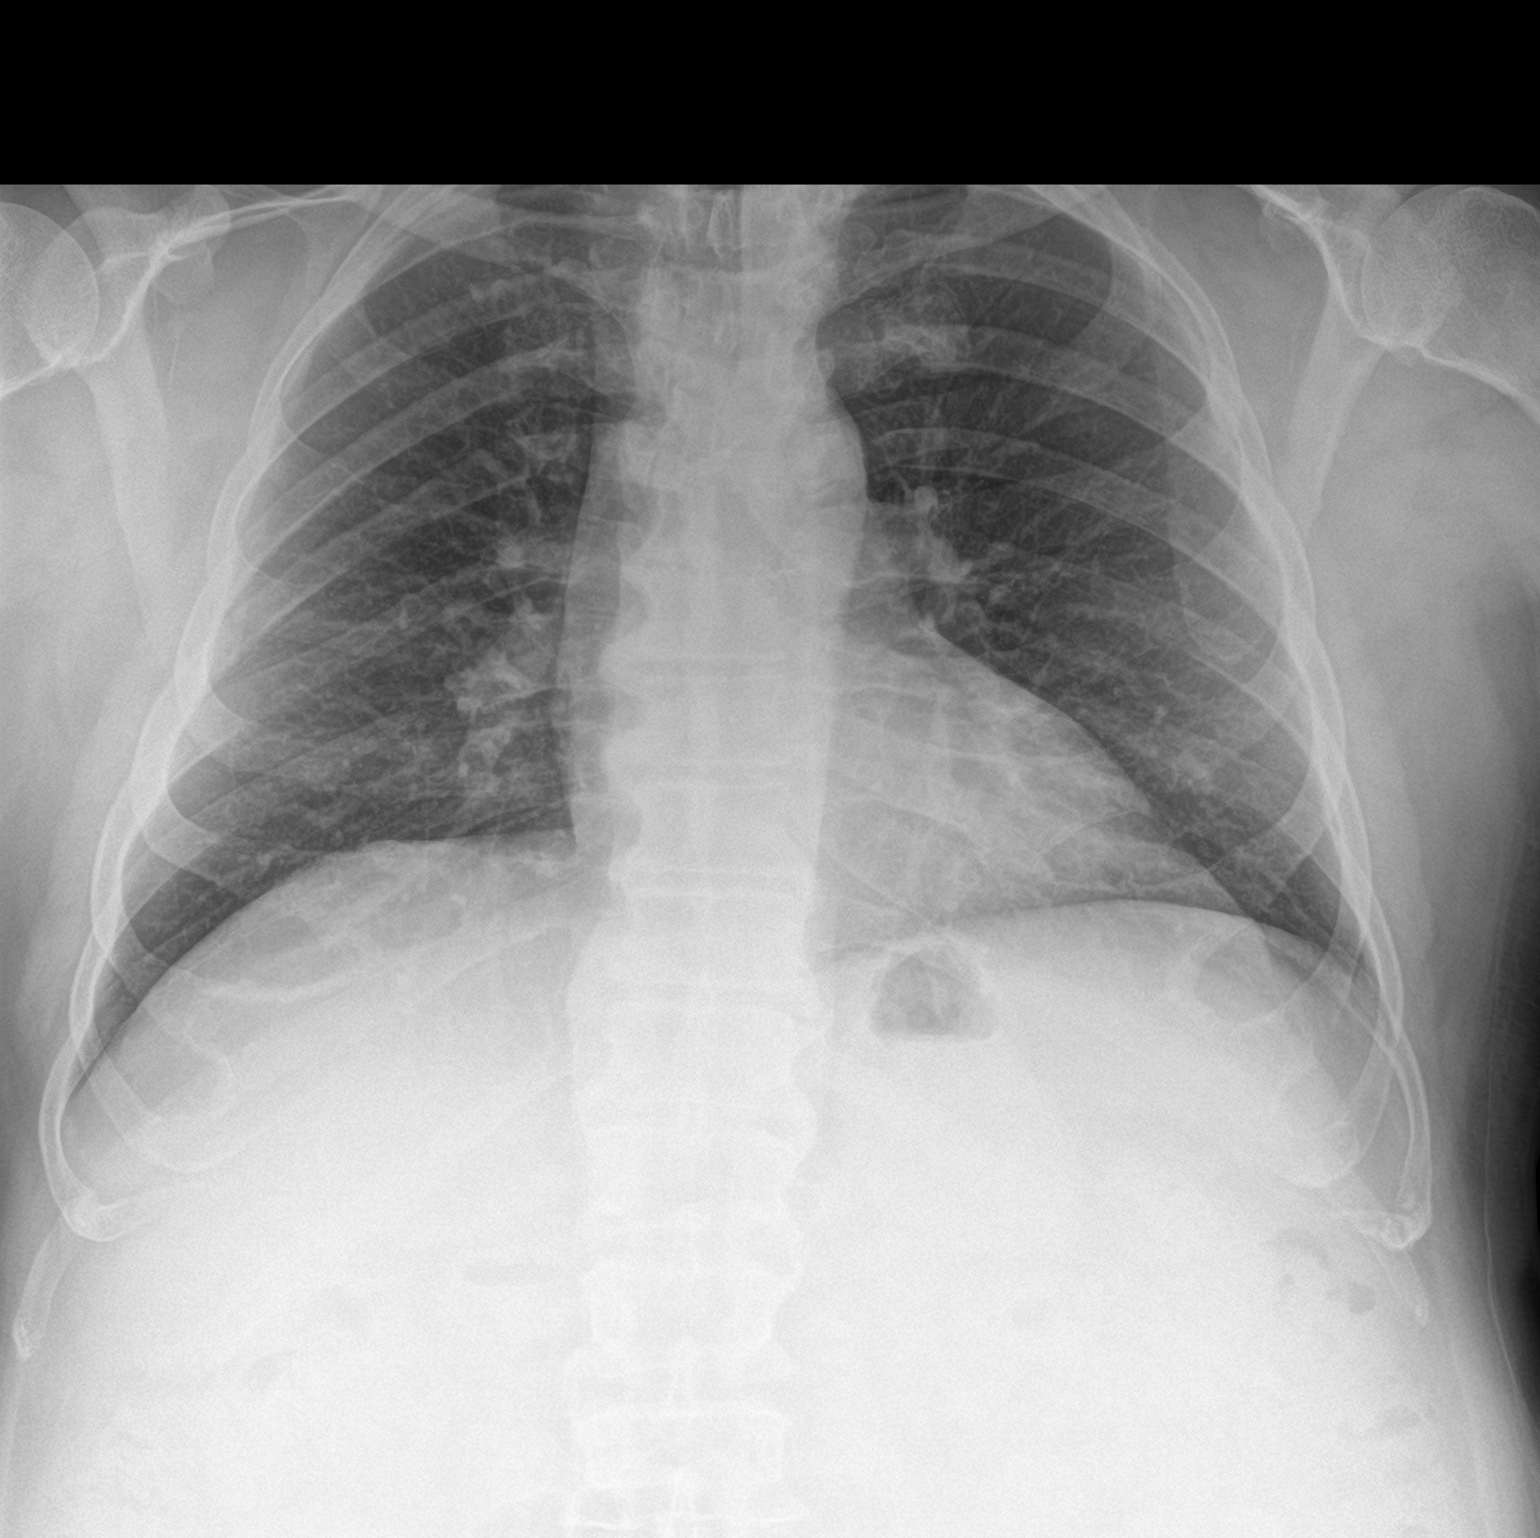

[chest lat]
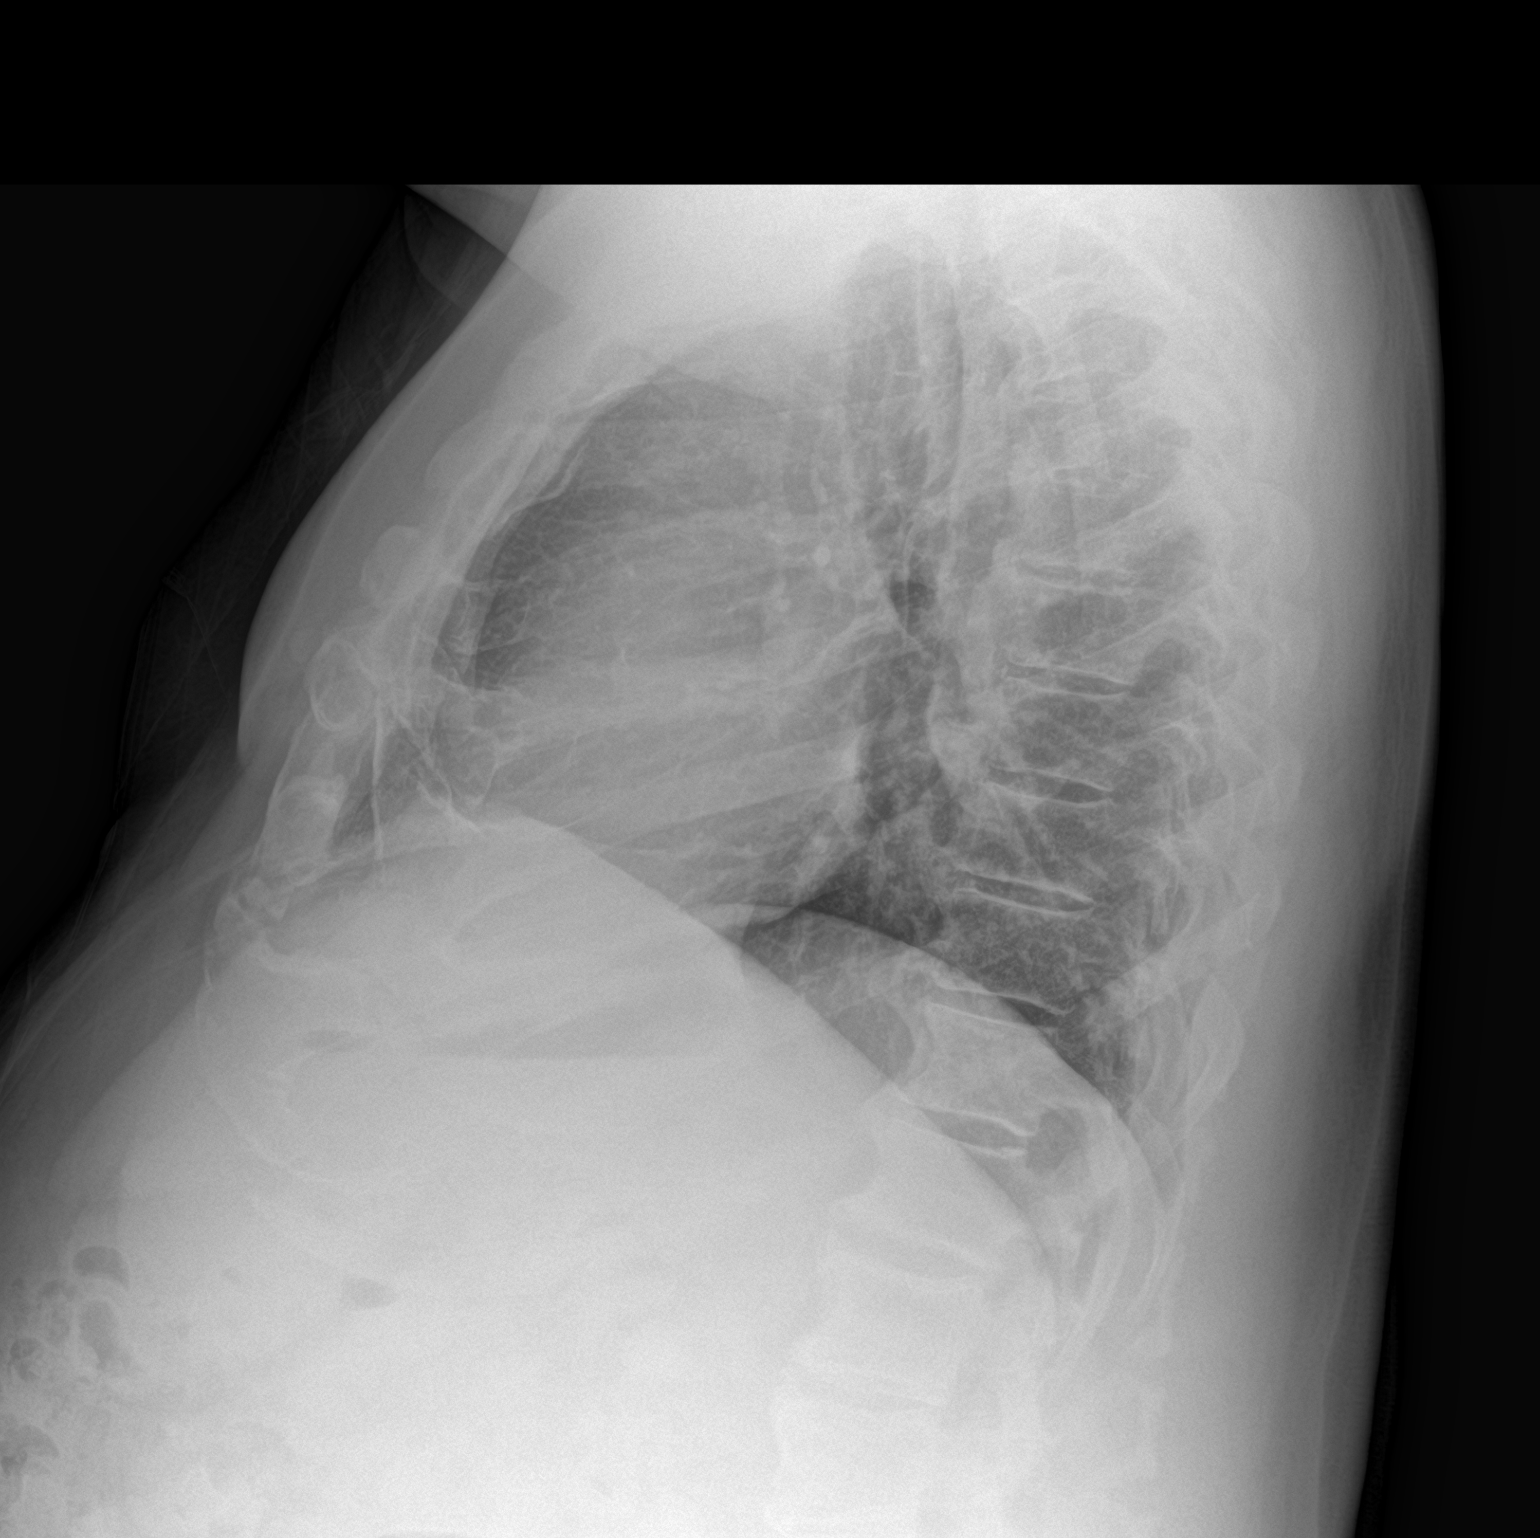

[2 of 2 positions shown; findings below may reference images not displayed]

FINDINGS: The cardiomediastinal silhouette is unremarkable.

There is no evidence of focal airspace disease, pulmonary edema,
suspicious pulmonary nodule/mass, pleural effusion, or pneumothorax.

No acute bony abnormalities are identified.
IMPRESSION: No active cardiopulmonary disease.

## 2021-12-29 ENCOUNTER — Encounter: Payer: Self-pay | Admitting: Ophthalmology

## 2022-01-05 ENCOUNTER — Ambulatory Visit: Payer: Medicare Other | Admitting: Anesthesiology

## 2022-01-05 ENCOUNTER — Ambulatory Visit
Admission: RE | Admit: 2022-01-05 | Discharge: 2022-01-05 | Disposition: A | Discharge status: Home / Self Care | Payer: Medicare Other | Source: Ambulatory Visit | Attending: Ophthalmology | Admitting: Ophthalmology

## 2022-01-05 ENCOUNTER — Encounter
Admission: RE | Disposition: A | Discharge status: Home / Self Care | Payer: Self-pay | Source: Ambulatory Visit | Attending: Ophthalmology

## 2022-01-05 ENCOUNTER — Encounter: Payer: Self-pay | Admitting: Ophthalmology

## 2022-01-05 ENCOUNTER — Other Ambulatory Visit: Payer: Self-pay

## 2022-01-05 DIAGNOSIS — Z6835 Body mass index (BMI) 35.0-35.9, adult: Secondary | ICD-10-CM | POA: Insufficient documentation

## 2022-01-05 DIAGNOSIS — Z87891 Personal history of nicotine dependence: Secondary | ICD-10-CM | POA: Insufficient documentation

## 2022-01-05 DIAGNOSIS — H2511 Age-related nuclear cataract, right eye: Secondary | ICD-10-CM | POA: Insufficient documentation

## 2022-01-05 DIAGNOSIS — M109 Gout, unspecified: Secondary | ICD-10-CM | POA: Diagnosis not present

## 2022-01-05 DIAGNOSIS — Z8546 Personal history of malignant neoplasm of prostate: Secondary | ICD-10-CM | POA: Insufficient documentation

## 2022-01-05 DIAGNOSIS — E669 Obesity, unspecified: Secondary | ICD-10-CM | POA: Diagnosis not present

## 2022-01-05 DIAGNOSIS — I10 Essential (primary) hypertension: Secondary | ICD-10-CM | POA: Diagnosis not present

## 2022-01-05 DIAGNOSIS — M1990 Unspecified osteoarthritis, unspecified site: Secondary | ICD-10-CM | POA: Diagnosis not present

## 2022-01-05 HISTORY — PX: CATARACT EXTRACTION W/PHACO: SHX586

## 2022-01-05 HISTORY — DX: Presence of external hearing-aid: Z97.4

## 2022-01-05 SURGERY — PHACOEMULSIFICATION, CATARACT, WITH IOL INSERTION
Anesthesia: Monitor Anesthesia Care | Site: Eye | Laterality: Right

## 2022-01-05 MED ORDER — TETRACAINE HCL 0.5 % OP SOLN
1.0000 [drp] | OPHTHALMIC | Status: DC | PRN
  Administered 2022-01-05 (×3): 1 [drp] via OPHTHALMIC

## 2022-01-05 MED ORDER — SIGHTPATH DOSE#1 BSS IO SOLN
INTRAOCULAR | Status: DC | PRN
  Administered 2022-01-05: 66 mL via OPHTHALMIC

## 2022-01-05 MED ORDER — SIGHTPATH DOSE#1 NA CHONDROIT SULF-NA HYALURON 40-17 MG/ML IO SOLN
INTRAOCULAR | Status: DC | PRN
  Administered 2022-01-05: 1 mL via INTRAOCULAR

## 2022-01-05 MED ORDER — FENTANYL CITRATE (PF) 100 MCG/2ML IJ SOLN
INTRAMUSCULAR | Status: DC | PRN
  Administered 2022-01-05: 50 ug via INTRAVENOUS

## 2022-01-05 MED ORDER — BRIMONIDINE TARTRATE-TIMOLOL 0.2-0.5 % OP SOLN
OPHTHALMIC | Status: DC | PRN
Start: 2022-01-05 — End: 2022-01-05
  Administered 2022-01-05: 1 [drp] via OPHTHALMIC

## 2022-01-05 MED ORDER — MOXIFLOXACIN HCL 0.5 % OP SOLN
OPHTHALMIC | Status: DC | PRN
  Administered 2022-01-05: 0.2 mL via OPHTHALMIC

## 2022-01-05 MED ORDER — MIDAZOLAM HCL 2 MG/2ML IJ SOLN
INTRAMUSCULAR | Status: DC | PRN
Start: 2022-01-05 — End: 2022-01-05
  Administered 2022-01-05: 1 mg via INTRAVENOUS

## 2022-01-05 MED ORDER — ARMC OPHTHALMIC DILATING DROPS
1.0000 "application " | OPHTHALMIC | Status: DC | PRN
  Administered 2022-01-05 (×3): 1 via OPHTHALMIC

## 2022-01-05 MED ORDER — SIGHTPATH DOSE#1 BSS IO SOLN
INTRAOCULAR | Status: DC | PRN
Start: 2022-01-05 — End: 2022-01-05
  Administered 2022-01-05: 15 mL

## 2022-01-05 MED ORDER — SIGHTPATH DOSE#1 BSS IO SOLN
INTRAOCULAR | Status: DC | PRN
  Administered 2022-01-05: 1 mL

## 2022-01-05 SURGICAL SUPPLY — 16 items
CANNULA ANT/CHMB 27G (MISCELLANEOUS) IMPLANT
CANNULA ANT/CHMB 27GA (MISCELLANEOUS) IMPLANT
CATARACT SUITE SIGHTPATH (MISCELLANEOUS) ×2 IMPLANT
FEE CATARACT SUITE SIGHTPATH (MISCELLANEOUS) ×1 IMPLANT
GLOVE SURG ENC TEXT LTX SZ8 (GLOVE) ×2 IMPLANT
GLOVE SURG TRIUMPH 8.0 PF LTX (GLOVE) ×2 IMPLANT
LENS IOL TECNIS EYHANCE 12.0 (Intraocular Lens) ×1 IMPLANT
NDL FILTER BLUNT 18X1 1/2 (NEEDLE) ×1 IMPLANT
NEEDLE FILTER BLUNT 18X 1/2SAF (NEEDLE) ×1
NEEDLE FILTER BLUNT 18X1 1/2 (NEEDLE) ×1 IMPLANT
PACK VIT ANT 23G (MISCELLANEOUS) IMPLANT
RING MALYGIN (MISCELLANEOUS) IMPLANT
SUT ETHILON 10-0 CS-B-6CS-B-6 (SUTURE)
SUTURE EHLN 10-0 CS-B-6CS-B-6 (SUTURE) IMPLANT
SYR 3ML LL SCALE MARK (SYRINGE) ×2 IMPLANT
WATER STERILE IRR 250ML POUR (IV SOLUTION) ×2 IMPLANT

## 2022-01-05 NOTE — H&P (Signed)
[place]   Primary Care Physician:  Rusty Aus, MD Ophthalmologist: Dr. George Ina  Pre-Procedure History & Physical: HPI:  Christian Pope. is a 76 y.o. male here for cataract surgery.   Past Medical History:  Diagnosis Date   Anxiety    history of anxiety/tx with xanax   Degenerative disc disease, cervical     C5-6   Ectopic beats    Gout    History of prostate biopsy 2017 & 2019   Hyperlipidemia    Hypertension    Nephrolithiasis    Nocturia    Prostate cancer ([place])    Urinary frequency    Wears glasses    Wears hearing aid in both ears     Past Surgical History:  Procedure Laterality Date   COLONOSCOPY WITH PROPOFOL N/A 08/17/2017   Procedure: COLONOSCOPY WITH PROPOFOL;  Surgeon: Toledo, Benay Pike, MD;  Location: [facility] ENDOSCOPY;  Service: Gastroenterology;  Laterality: N/A;   COLONOSCOPY WITH PROPOFOL N/A 08/30/2018   Procedure: COLONOSCOPY WITH PROPOFOL;  Surgeon: Toledo, Benay Pike, MD;  Location: [facility] ENDOSCOPY;  Service: Gastroenterology;  Laterality: N/A;   PROSTATE BIOPSY     x2   RADIOACTIVE SEED IMPLANT N/A 03/13/2018   Procedure: RADIOACTIVE SEED IMPLANT/BRACHYTHERAPY IMPLANT;  Surgeon: Kathie Rhodes, MD;  Location: [place];  Service: Urology;  Laterality: N/A;   SPACE OAR INSTILLATION N/A 03/13/2018   Procedure: SPACE OAR INSTILLATION;  Surgeon: Kathie Rhodes, MD;  Location: [facility];  Service: Urology;  Laterality: N/A;    Prior to Admission medications   Medication Sig Start Date End Date Taking? Authorizing Provider  alfuzosin (UROXATRAL) 10 MG 24 hr tablet Take 10 mg by mouth daily. Take with dinner   Yes [provider]  allopurinol (ZYLOPRIM) 300 MG tablet Take 300 mg by mouth every evening.  09/09/17  Yes [provider]  ALPRAZolam (XANAX) 0.25 MG tablet Take 0.25 mg by mouth 2 (two) times daily as needed for anxiety.  08/31/16  Yes [provider]  aspirin EC 81 MG tablet Take 81 mg  by mouth daily. Stopped Sunday. 7/28   Yes [provider]  Cholecalciferol 25 MCG (1000 UT) tablet Take by mouth daily.    Yes [provider]  colchicine 0.6 MG tablet Take 2 tablets (1.'2mg'$ ) by mouth at first sign of gout flare followed by 1 tablet (0.'6mg'$ ) after 1 hour. (Max 1.'8mg'$  within 1 hour) 08/26/14  Yes [provider]  lisinopril-hydrochlorothiazide (PRINZIDE,ZESTORETIC) 20-12.5 MG tablet 0.5 tablets. 01/14/17  Yes [provider]  magnesium oxide (MAG-OX) 400 MG tablet Take 500 mg by mouth daily. Stopped week ago   Yes [provider]  meloxicam (MOBIC) 15 MG tablet Take 15 mg by mouth daily.   Yes [provider]  sildenafil (REVATIO) 20 MG tablet Take by mouth daily as needed.    Yes [provider]  simvastatin (ZOCOR) 20 MG tablet Take 20 mg by mouth.  07/22/17  Yes [provider]  docusate sodium (COLACE) 100 MG capsule Take 100 mg by mouth daily.    [provider]    Allergies as of 10/21/2021   (No Known Allergies)    Family History  Problem Relation Age of Onset   Melanoma Father    Prostate cancer Father    Breast cancer Sister    Breast cancer Sister     Social History   Socioeconomic History   Marital status: Married    Spouse  name: Not on file   Number of children: Not on file   Years of education: Not on file   Highest education level: Not on file  Occupational History   Occupation: retired  Tobacco Use   Smoking status: Former    Packs/day: 0.25    Years: 2.00    Pack years: 0.50    Types: Cigars, Cigarettes    Quit date: 08/09/2017    Years since quitting: 4.4   Smokeless tobacco: Former    Types: Chew    Quit date: 08/09/1973   Tobacco comments:    May smoke occasional cigar(12/29/21)  Vaping Use   Vaping Use: Never used  Substance and Sexual Activity   Alcohol use: Yes    Comment: occasionally NONE LAST 24HRS   Drug use: No   Sexual activity: Yes  Other Topics  Concern   Not on file  Social History Narrative   Resides in [place], [place] between [place] and [place]. Approximately one hour drive to [facility]. Has two adult children. Retired from [employer]. Has three grandchildren.    Social Determinants of Health   Financial Resource Strain: Not on file  Food Insecurity: Not on file  Transportation Needs: Not on file  Physical Activity: Not on file  Stress: Not on file  Social Connections: Not on file  Intimate Partner Violence: Not on file    Review of Systems: See HPI, otherwise negative ROS  Physical Exam: BP (!) 163/64   Pulse 64   Temp (!) 97.5 F (36.4 C) (Temporal)   Resp 20   Ht '5\' 7"'$  (1.702 m)   Wt 103.4 kg   SpO2 98%   BMI 35.71 kg/m  General:   Alert, cooperative in NAD Head:  Normocephalic and atraumatic. Respiratory:  Normal work of breathing. Cardiovascular:  RRR  Impression/Plan: Christian Hay Sr. is here for cataract surgery.  Risks, benefits, limitations, and alternatives regarding cataract surgery have been reviewed with the patient.  Questions have been answered.  All parties agreeable.   Birder Robson, MD  01/05/2022, 10:04 AM

## 2022-01-05 NOTE — Op Note (Signed)
PREOPERATIVE DIAGNOSIS:  Nuclear sclerotic cataract of the right eye.   POSTOPERATIVE DIAGNOSIS:  CATARACT   OPERATIVE PROCEDURE:ORPROCALL@   SURGEON:  Birder Robson, MD.   ANESTHESIA:  Anesthesiologist: Veda Canning, MD CRNA: Silvana Newness, CRNA  1.      Managed anesthesia care. 2.      0.66m of Shugarcaine was instilled in the eye following the paracentesis.   COMPLICATIONS:  None.   TECHNIQUE:   Stop and chop   DESCRIPTION OF PROCEDURE:  The patient was examined and consented in the preoperative holding area where the aforementioned topical anesthesia was applied to the right eye and then brought back to the Operating Room where the right eye was prepped and draped in the usual sterile ophthalmic fashion and a lid speculum was placed. A paracentesis was created with the side port blade and the anterior chamber was filled with viscoelastic. A near clear corneal incision was performed with the steel keratome. A continuous curvilinear capsulorrhexis was performed with a cystotome followed by the capsulorrhexis forceps. Hydrodissection and hydrodelineation were carried out with BSS on a blunt cannula. The lens was removed in a stop and chop  technique and the remaining cortical material was removed with the irrigation-aspiration handpiece. The capsular bag was inflated with viscoelastic and the Technis ZCB00  lens was placed in the capsular bag without complication. The remaining viscoelastic was removed from the eye with the irrigation-aspiration handpiece. The wounds were hydrated. The anterior chamber was flushed with BSS and the eye was inflated to physiologic pressure. 0.147mof Vigamox was placed in the anterior chamber. The wounds were found to be water tight. The eye was dressed with Combigan. The patient was given protective glasses to wear throughout the day and a shield with which to sleep tonight. The patient was also given drops with which to begin a drop regimen today and will  follow-up with me in one day. Implant Name Type Inv. Item Serial No. Manufacturer Lot No. LRB No. Used Action  LENS IOL TECNIS EYHANCE 12.0 - [id number] Lens LENS IOL TECNIS EYHANCE 12.0 [id number]  Right 1 Implanted   Procedure(s) with comments: CATARACT EXTRACTION PHACO AND INTRAOCULAR LENS PLACEMENT (IOC) RIGHT (Right) - 6.80 0:42.3  Electronically signed: WiBirder Robson/30/2023 10:28 AM

## 2022-01-05 NOTE — Transfer of Care (Signed)
Immediate Anesthesia Transfer of Care Note  Patient: Christian Hay Sr.  Procedure(s) Performed: CATARACT EXTRACTION PHACO AND INTRAOCULAR LENS PLACEMENT ([facility]) RIGHT (Right: Eye)  Patient Location: PACU  Anesthesia Type: MAC  Level of Consciousness: awake, alert  and patient cooperative  Airway and Oxygen Therapy: Patient Spontanous Breathing and Patient connected to supplemental oxygen  Post-op Assessment: Post-op Vital signs reviewed, Patient's Cardiovascular Status Stable, Respiratory Function Stable, Patent Airway and No signs of Nausea or vomiting  Post-op Vital Signs: Reviewed and stable  Complications: No notable events documented.

## 2022-01-05 NOTE — Anesthesia Preprocedure Evaluation (Addendum)
Anesthesia Evaluation  Patient identified by MRN, date of birth, ID band Patient awake    Reviewed: Allergy & Precautions, NPO status   Airway Mallampati: II  TM Distance: >3 FB     Dental   Pulmonary former smoker,    Pulmonary exam normal        Cardiovascular hypertension,  Rhythm:Regular Rate:Normal     Neuro/Psych Anxiety    GI/Hepatic   Endo/Other  Obesity - BMI > 30  Renal/GU      Musculoskeletal  (+) Arthritis , Gout   Abdominal   Peds  Hematology   Anesthesia Other Findings Prostate cancer  Reproductive/Obstetrics                             Anesthesia Physical Anesthesia Plan  ASA: 2  Anesthesia Plan: MAC   Post-op Pain Management: Minimal or no pain anticipated   Induction: Intravenous  PONV Risk Score and Plan: TIVA, Midazolam and Treatment may vary due to age or medical condition  Airway Management Planned: Natural Airway and Nasal Cannula  Additional Equipment:   Intra-op Plan:   Post-operative Plan:   Informed Consent: I have reviewed the patients History and Physical, chart, labs and discussed the procedure including the risks, benefits and alternatives for the proposed anesthesia with the patient or authorized representative who has indicated his/her understanding and acceptance.       Plan Discussed with: CRNA  Anesthesia Plan Comments:        Anesthesia Quick Evaluation

## 2022-01-05 NOTE — Anesthesia Postprocedure Evaluation (Signed)
Anesthesia Post Note  Patient: Christian Hay Sr.  Procedure(s) Performed: CATARACT EXTRACTION PHACO AND INTRAOCULAR LENS PLACEMENT ([place]) RIGHT (Right: Eye)     Patient location during evaluation: PACU Anesthesia Type: MAC Level of consciousness: awake Pain management: pain level controlled Vital Signs Assessment: post-procedure vital signs reviewed and stable Respiratory status: respiratory function stable Cardiovascular status: stable Postop Assessment: no apparent nausea or vomiting Anesthetic complications: no   No notable events documented.  Veda Canning

## 2022-01-06 ENCOUNTER — Encounter: Payer: Self-pay | Admitting: Ophthalmology

## 2022-01-18 NOTE — Discharge Instructions (Signed)

## 2022-01-19 ENCOUNTER — Ambulatory Visit: Payer: Medicare Other | Admitting: Anesthesiology

## 2022-01-19 ENCOUNTER — Encounter: Payer: Self-pay | Admitting: Ophthalmology

## 2022-01-19 ENCOUNTER — Other Ambulatory Visit: Payer: Self-pay

## 2022-01-19 ENCOUNTER — Encounter
Admission: RE | Disposition: A | Discharge status: Home / Self Care | Payer: Self-pay | Source: Ambulatory Visit | Attending: Ophthalmology

## 2022-01-19 ENCOUNTER — Ambulatory Visit
Admission: RE | Admit: 2022-01-19 | Discharge: 2022-01-19 | Disposition: A | Discharge status: Home / Self Care | Payer: Medicare Other | Source: Ambulatory Visit | Attending: Ophthalmology | Admitting: Ophthalmology

## 2022-01-19 DIAGNOSIS — M109 Gout, unspecified: Secondary | ICD-10-CM | POA: Insufficient documentation

## 2022-01-19 DIAGNOSIS — Z87891 Personal history of nicotine dependence: Secondary | ICD-10-CM | POA: Insufficient documentation

## 2022-01-19 DIAGNOSIS — E669 Obesity, unspecified: Secondary | ICD-10-CM | POA: Diagnosis not present

## 2022-01-19 DIAGNOSIS — F419 Anxiety disorder, unspecified: Secondary | ICD-10-CM | POA: Insufficient documentation

## 2022-01-19 DIAGNOSIS — H2512 Age-related nuclear cataract, left eye: Secondary | ICD-10-CM | POA: Insufficient documentation

## 2022-01-19 DIAGNOSIS — M1990 Unspecified osteoarthritis, unspecified site: Secondary | ICD-10-CM | POA: Insufficient documentation

## 2022-01-19 DIAGNOSIS — Z6835 Body mass index (BMI) 35.0-35.9, adult: Secondary | ICD-10-CM | POA: Insufficient documentation

## 2022-01-19 DIAGNOSIS — Z8546 Personal history of malignant neoplasm of prostate: Secondary | ICD-10-CM | POA: Insufficient documentation

## 2022-01-19 DIAGNOSIS — I10 Essential (primary) hypertension: Secondary | ICD-10-CM | POA: Insufficient documentation

## 2022-01-19 HISTORY — PX: CATARACT EXTRACTION W/PHACO: SHX586

## 2022-01-19 SURGERY — PHACOEMULSIFICATION, CATARACT, WITH IOL INSERTION
Anesthesia: Monitor Anesthesia Care | Site: Eye | Laterality: Left

## 2022-01-19 MED ORDER — SIGHTPATH DOSE#1 NA CHONDROIT SULF-NA HYALURON 40-17 MG/ML IO SOLN
INTRAOCULAR | Status: DC | PRN
  Administered 2022-01-19: 1 mL via INTRAOCULAR

## 2022-01-19 MED ORDER — LACTATED RINGERS IV SOLN: INTRAVENOUS | Status: DC

## 2022-01-19 MED ORDER — BRIMONIDINE TARTRATE-TIMOLOL 0.2-0.5 % OP SOLN
OPHTHALMIC | Status: DC | PRN
  Administered 2022-01-19: 1 [drp] via OPHTHALMIC

## 2022-01-19 MED ORDER — ACETAMINOPHEN 160 MG/5ML PO SOLN: 325.0000 mg | Freq: Once | ORAL | Status: DC

## 2022-01-19 MED ORDER — SIGHTPATH DOSE#1 BSS IO SOLN
INTRAOCULAR | Status: DC | PRN
  Administered 2022-01-19: 15 mL

## 2022-01-19 MED ORDER — FENTANYL CITRATE (PF) 100 MCG/2ML IJ SOLN
INTRAMUSCULAR | Status: DC | PRN
Start: 2022-01-19 — End: 2022-01-19
  Administered 2022-01-19: 50 ug via INTRAVENOUS

## 2022-01-19 MED ORDER — ARMC OPHTHALMIC DILATING DROPS
1.0000 "application " | OPHTHALMIC | Status: DC | PRN
  Administered 2022-01-19 (×3): 1 via OPHTHALMIC

## 2022-01-19 MED ORDER — TETRACAINE HCL 0.5 % OP SOLN
1.0000 [drp] | OPHTHALMIC | Status: DC | PRN
  Administered 2022-01-19 (×3): 1 [drp] via OPHTHALMIC

## 2022-01-19 MED ORDER — SIGHTPATH DOSE#1 BSS IO SOLN
INTRAOCULAR | Status: DC | PRN
  Administered 2022-01-19: 1 mL via INTRAMUSCULAR

## 2022-01-19 MED ORDER — MOXIFLOXACIN HCL 0.5 % OP SOLN
OPHTHALMIC | Status: DC | PRN
  Administered 2022-01-19: 0.2 mL via OPHTHALMIC

## 2022-01-19 MED ORDER — MIDAZOLAM HCL 2 MG/2ML IJ SOLN
INTRAMUSCULAR | Status: DC | PRN
  Administered 2022-01-19: 1 mg via INTRAVENOUS

## 2022-01-19 MED ORDER — ACETAMINOPHEN 325 MG PO TABS: 325.0000 mg | ORAL_TABLET | Freq: Once | ORAL | Status: DC

## 2022-01-19 MED ORDER — SIGHTPATH DOSE#1 BSS IO SOLN
INTRAOCULAR | Status: DC | PRN
  Administered 2022-01-19: 93 mL via OPHTHALMIC

## 2022-01-19 SURGICAL SUPPLY — 10 items
CATARACT SUITE SIGHTPATH (MISCELLANEOUS) ×2 IMPLANT
FEE CATARACT SUITE SIGHTPATH (MISCELLANEOUS) ×1 IMPLANT
GLOVE SURG ENC TEXT LTX SZ8 (GLOVE) ×2 IMPLANT
GLOVE SURG TRIUMPH 8.0 PF LTX (GLOVE) ×2 IMPLANT
LENS IOL TECNIS EYHANCE 14.0 (Intraocular Lens) ×1 IMPLANT
NDL FILTER BLUNT 18X1 1/2 (NEEDLE) ×1 IMPLANT
NEEDLE FILTER BLUNT 18X 1/2SAF (NEEDLE) ×1
NEEDLE FILTER BLUNT 18X1 1/2 (NEEDLE) ×1 IMPLANT
SYR 3ML LL SCALE MARK (SYRINGE) ×2 IMPLANT
WATER STERILE IRR 250ML POUR (IV SOLUTION) ×2 IMPLANT

## 2022-01-19 NOTE — Op Note (Signed)
PREOPERATIVE DIAGNOSIS:  Nuclear sclerotic cataract of the left eye.   POSTOPERATIVE DIAGNOSIS:  Nuclear sclerotic cataract of the left eye.   OPERATIVE PROCEDURE:ORPROCALL@   SURGEON:  Birder Robson, MD.   ANESTHESIA:  Anesthesiologist: Ronelle Nigh, MD CRNA: Dionne Bucy, CRNA  1.      Managed anesthesia care. 2.     0.23m of Shugarcaine was instilled following the paracentesis   COMPLICATIONS:  None.   TECHNIQUE:   Stop and chop   DESCRIPTION OF PROCEDURE:  The patient was examined and consented in the preoperative holding area where the aforementioned topical anesthesia was applied to the left eye and then brought back to the Operating Room where the left eye was prepped and draped in the usual sterile ophthalmic fashion and a lid speculum was placed. A paracentesis was created with the side port blade and the anterior chamber was filled with viscoelastic. A near clear corneal incision was performed with the steel keratome. A continuous curvilinear capsulorrhexis was performed with a cystotome followed by the capsulorrhexis forceps. Hydrodissection and hydrodelineation were carried out with BSS on a blunt cannula. The lens was removed in a stop and chop  technique and the remaining cortical material was removed with the irrigation-aspiration handpiece. The capsular bag was inflated with viscoelastic and the Technis ZCB00 lens was placed in the capsular bag without complication. The remaining viscoelastic was removed from the eye with the irrigation-aspiration handpiece. The wounds were hydrated. The anterior chamber was flushed with BSS and the eye was inflated to physiologic pressure. 0.128mVigamox was placed in the anterior chamber. The wounds were found to be water tight. The eye was dressed with Combigan. The patient was given protective glasses to wear throughout the day and a shield with which to sleep tonight. The patient was also given drops with which to begin a drop regimen  today and will follow-up with me in one day. Implant Name Type Inv. Item Serial No. Manufacturer Lot No. LRB No. Used Action  LENS IOL TECNIS EYHANCE 14.0 - [id number] Lens LENS IOL TECNIS EYHANCE 14.0 [id number]  Left 1 Implanted    Procedure(s): CATARACT EXTRACTION PHACO AND INTRAOCULAR LENS PLACEMENT (I[facility]) LEFT 5.04 00:30.6 (Left)  Electronically signed: WiBirder Robson/13/2023 8:57 AM

## 2022-01-19 NOTE — Anesthesia Procedure Notes (Signed)
Procedure Name: MAC Date/Time: 01/19/2022 8:36 AM  Performed by: Dionne Bucy, CRNAPre-anesthesia Checklist: Patient identified, Emergency Drugs available, Suction available, Patient being monitored and Timeout performed Patient Re-evaluated:Patient Re-evaluated prior to induction Oxygen Delivery Method: Nasal cannula Placement Confirmation: positive ETCO2

## 2022-01-19 NOTE — Anesthesia Postprocedure Evaluation (Signed)
Anesthesia Post Note  Patient: Christian Hay Sr.  Procedure(s) Performed: CATARACT EXTRACTION PHACO AND INTRAOCULAR LENS PLACEMENT ([facility]) LEFT 5.04 00:30.6 (Left: Eye)     Patient location during evaluation: PACU Anesthesia Type: MAC Level of consciousness: awake and alert and oriented Pain management: satisfactory to patient Vital Signs Assessment: post-procedure vital signs reviewed and stable Respiratory status: spontaneous breathing, nonlabored ventilation and respiratory function stable Cardiovascular status: blood pressure returned to baseline and stable Postop Assessment: Adequate PO intake and No signs of nausea or vomiting Anesthetic complications: no   No notable events documented.  Raliegh Ip

## 2022-01-19 NOTE — Transfer of Care (Signed)
Immediate Anesthesia Transfer of Care Note  Patient: Christian Hay Sr.  Procedure(s) Performed: CATARACT EXTRACTION PHACO AND INTRAOCULAR LENS PLACEMENT ([facility]) LEFT 5.04 00:30.6 (Left: Eye)  Patient Location: PACU  Anesthesia Type: MAC  Level of Consciousness: awake, alert  and patient cooperative  Airway and Oxygen Therapy: Patient Spontanous Breathing and Patient connected to supplemental oxygen  Post-op Assessment: Post-op Vital signs reviewed, Patient's Cardiovascular Status Stable, Respiratory Function Stable, Patent Airway and No signs of Nausea or vomiting  Post-op Vital Signs: Reviewed and stable  Complications: No notable events documented.

## 2022-01-19 NOTE — H&P (Signed)
[place]   Primary Care Physician:  Rusty Aus, MD Ophthalmologist: Dr. George Ina  Pre-Procedure History & Physical: HPI:  Christian Pope. is a 76 y.o. male here for cataract surgery.   Past Medical History:  Diagnosis Date   Anxiety    history of anxiety/tx with xanax   Degenerative disc disease, cervical     C5-6   Ectopic beats    Gout    History of prostate biopsy 2017 & 2019   Hyperlipidemia    Hypertension    Nephrolithiasis    Nocturia    Prostate cancer ([place])    Urinary frequency    Wears glasses    Wears hearing aid in both ears     Past Surgical History:  Procedure Laterality Date   CATARACT EXTRACTION W/PHACO Right 01/05/2022   Procedure: CATARACT EXTRACTION PHACO AND INTRAOCULAR LENS PLACEMENT ([place]) RIGHT;  Surgeon: Birder Robson, MD;  Location: [place];  Service: Ophthalmology;  Laterality: Right;  6.80 0:42.3   COLONOSCOPY WITH PROPOFOL N/A 08/17/2017   Procedure: COLONOSCOPY WITH PROPOFOL;  Surgeon: Toledo, Benay Pike, MD;  Location: [facility];  Service: Gastroenterology;  Laterality: N/A;   COLONOSCOPY WITH PROPOFOL N/A 08/30/2018   Procedure: COLONOSCOPY WITH PROPOFOL;  Surgeon: Toledo, Benay Pike, MD;  Location: [facility];  Service: Gastroenterology;  Laterality: N/A;   PROSTATE BIOPSY     x2   RADIOACTIVE SEED IMPLANT N/A 03/13/2018   Procedure: RADIOACTIVE SEED IMPLANT/BRACHYTHERAPY IMPLANT;  Surgeon: Kathie Rhodes, MD;  Location: [place];  Service: Urology;  Laterality: N/A;   SPACE OAR INSTILLATION N/A 03/13/2018   Procedure: SPACE OAR INSTILLATION;  Surgeon: Kathie Rhodes, MD;  Location: [facility];  Service: Urology;  Laterality: N/A;    Prior to Admission medications   Medication Sig Start Date End Date Taking? Authorizing Provider  alfuzosin (UROXATRAL) 10 MG 24 hr tablet Take 10 mg by mouth daily. Take with dinner   Yes [provider]  allopurinol (ZYLOPRIM) 300 MG  tablet Take 300 mg by mouth every evening.  09/09/17  Yes [provider]  ALPRAZolam (XANAX) 0.25 MG tablet Take 0.25 mg by mouth 2 (two) times daily as needed for anxiety.  08/31/16  Yes [provider]  aspirin EC 81 MG tablet Take 81 mg by mouth daily. Stopped Sunday. 7/28   Yes [provider]  Cholecalciferol 25 MCG (1000 UT) tablet Take by mouth daily.    Yes [provider]  colchicine 0.6 MG tablet Take 2 tablets (1.'2mg'$ ) by mouth at first sign of gout flare followed by 1 tablet (0.'6mg'$ ) after 1 hour. (Max 1.'8mg'$  within 1 hour) 08/26/14  Yes [provider]  docusate sodium (COLACE) 100 MG capsule Take 100 mg by mouth daily.   Yes [provider]  lisinopril-hydrochlorothiazide (PRINZIDE,ZESTORETIC) 20-12.5 MG tablet 0.5 tablets. 01/14/17  Yes [provider]  magnesium oxide (MAG-OX) 400 MG tablet Take 500 mg by mouth daily. Stopped week ago   Yes [provider]  meloxicam (MOBIC) 15 MG tablet Take 15 mg by mouth daily.   Yes [provider]  sildenafil (REVATIO) 20 MG tablet Take by mouth daily as needed.    Yes [provider]  simvastatin (ZOCOR) 20 MG tablet Take 20 mg by mouth.  07/22/17  Yes [provider]    Allergies as of 10/21/2021   (No Known Allergies)    Family History  Problem Relation Age of Onset   Melanoma  Father    Prostate cancer Father    Breast cancer Sister    Breast cancer Sister     Social History   Socioeconomic History   Marital status: Married    Spouse name: Not on file   Number of children: Not on file   Years of education: Not on file   Highest education level: Not on file  Occupational History   Occupation: retired  Tobacco Use   Smoking status: Former    Packs/day: 0.25    Years: 2.00    Total pack years: 0.50    Types: Cigars, Cigarettes    Quit date: 08/09/2017    Years since quitting: 4.4   Smokeless tobacco: Former    Types: Chew    Quit  date: 08/09/1973   Tobacco comments:    May smoke occasional cigar(12/29/21)  Vaping Use   Vaping Use: Never used  Substance and Sexual Activity   Alcohol use: Yes    Comment: occasionally NONE LAST 24HRS   Drug use: No   Sexual activity: Yes  Other Topics Concern   Not on file  Social History Narrative   Resides in [place], [place] between [place] and [place]. Approximately one hour drive to [place]. Has two adult children. Retired from [employer]. Has three grandchildren.    Social Determinants of Health   Financial Resource Strain: Not on file  Food Insecurity: Not on file  Transportation Needs: Not on file  Physical Activity: Not on file  Stress: Not on file  Social Connections: Not on file  Intimate Partner Violence: Not on file    Review of Systems: See HPI, otherwise negative ROS  Physical Exam: BP 129/60   Pulse (!) 55   Temp (!) 97 F (36.1 C) (Temporal)   Wt 103.5 kg   SpO2 99%   BMI 35.74 kg/m  General:   Alert, cooperative in NAD Head:  Normocephalic and atraumatic. Respiratory:  Normal work of breathing. Cardiovascular:  RRR  Impression/Plan: Christian Hay Sr. is here for cataract surgery.  Risks, benefits, limitations, and alternatives regarding cataract surgery have been reviewed with the patient.  Questions have been answered.  All parties agreeable.   Birder Robson, MD  01/19/2022, 8:28 AM

## 2022-01-19 NOTE — Anesthesia Preprocedure Evaluation (Signed)
Anesthesia Evaluation  Patient identified by MRN, date of birth, ID band Patient awake    Reviewed: Allergy & Precautions, NPO status   Airway Mallampati: II  TM Distance: >3 FB     Dental   Pulmonary former smoker,    Pulmonary exam normal        Cardiovascular hypertension,  Rhythm:Regular Rate:Normal     Neuro/Psych Anxiety    GI/Hepatic   Endo/Other  Obesity - BMI > 30  Renal/GU      Musculoskeletal  (+) Arthritis , Gout   Abdominal   Peds  Hematology   Anesthesia Other Findings Prostate cancer  Reproductive/Obstetrics                             Anesthesia Physical  Anesthesia Plan  ASA: 2  Anesthesia Plan: MAC   Post-op Pain Management: Minimal or no pain anticipated   Induction: Intravenous  PONV Risk Score and Plan: 1 and TIVA, Midazolam and Treatment may vary due to age or medical condition  Airway Management Planned: Natural Airway and Nasal Cannula  Additional Equipment:   Intra-op Plan:   Post-operative Plan:   Informed Consent: I have reviewed the patients History and Physical, chart, labs and discussed the procedure including the risks, benefits and alternatives for the proposed anesthesia with the patient or authorized representative who has indicated his/her understanding and acceptance.       Plan Discussed with: CRNA  Anesthesia Plan Comments:         Anesthesia Quick Evaluation

## 2022-01-20 ENCOUNTER — Encounter: Payer: Self-pay | Admitting: Ophthalmology

## 2023-12-21 ENCOUNTER — Other Ambulatory Visit
Admission: RE | Admit: 2023-12-21 | Discharge: 2023-12-21 | Disposition: A | Discharge status: Home / Self Care | Source: Ambulatory Visit

## 2023-12-21 DIAGNOSIS — R600 Localized edema: Secondary | ICD-10-CM | POA: Diagnosis present

## 2023-12-21 LAB — D-DIMER, QUANTITATIVE: D-Dimer, Quant: 0.51 ug{FEU}/mL — ABNORMAL HIGH (ref 0.00–0.50)

## 2024-01-04 ENCOUNTER — Ambulatory Visit
Admission: RE | Admit: 2024-01-04 | Discharge: 2024-01-04 | Disposition: A | Discharge status: Home / Self Care | Source: Ambulatory Visit | Attending: Internal Medicine | Admitting: Internal Medicine

## 2024-01-04 ENCOUNTER — Other Ambulatory Visit: Payer: Self-pay | Admitting: Internal Medicine

## 2024-01-04 DIAGNOSIS — M7989 Other specified soft tissue disorders: Secondary | ICD-10-CM | POA: Insufficient documentation

## 2024-06-06 ENCOUNTER — Encounter
Admission: RE | Disposition: A | Discharge status: Home / Self Care | Payer: Self-pay | Source: Home / Self Care | Attending: Internal Medicine

## 2024-06-06 ENCOUNTER — Ambulatory Visit: Admitting: Anesthesiology

## 2024-06-06 ENCOUNTER — Ambulatory Visit
Admission: RE | Admit: 2024-06-06 | Discharge: 2024-06-06 | Disposition: A | Discharge status: Home / Self Care | Attending: Internal Medicine | Admitting: Internal Medicine

## 2024-06-06 ENCOUNTER — Encounter: Payer: Self-pay | Admitting: Internal Medicine

## 2024-06-06 DIAGNOSIS — E66813 Obesity, class 3: Secondary | ICD-10-CM | POA: Insufficient documentation

## 2024-06-06 DIAGNOSIS — I10 Essential (primary) hypertension: Secondary | ICD-10-CM | POA: Insufficient documentation

## 2024-06-06 DIAGNOSIS — Z1211 Encounter for screening for malignant neoplasm of colon: Secondary | ICD-10-CM | POA: Diagnosis present

## 2024-06-06 DIAGNOSIS — Z860101 Personal history of adenomatous and serrated colon polyps: Secondary | ICD-10-CM | POA: Insufficient documentation

## 2024-06-06 DIAGNOSIS — K640 First degree hemorrhoids: Secondary | ICD-10-CM | POA: Diagnosis not present

## 2024-06-06 DIAGNOSIS — K5730 Diverticulosis of large intestine without perforation or abscess without bleeding: Secondary | ICD-10-CM | POA: Diagnosis not present

## 2024-06-06 HISTORY — PX: COLONOSCOPY: SHX5424

## 2024-06-06 SURGERY — COLONOSCOPY
Anesthesia: General

## 2024-06-06 MED ORDER — SODIUM CHLORIDE 0.9 % IV SOLN: INTRAVENOUS | Status: DC

## 2024-06-06 MED ORDER — PROPOFOL 500 MG/50ML IV EMUL
INTRAVENOUS | Status: DC | PRN
  Administered 2024-06-06: 75 ug/kg/min via INTRAVENOUS

## 2024-06-06 MED ORDER — LIDOCAINE HCL (CARDIAC) PF 100 MG/5ML IV SOSY
PREFILLED_SYRINGE | INTRAVENOUS | Status: DC | PRN
  Administered 2024-06-06: 100 mg via INTRAVENOUS

## 2024-06-06 MED ORDER — LIDOCAINE HCL (PF) 2 % IJ SOLN
INTRAMUSCULAR | Status: AC
  Filled 2024-06-06: qty 5

## 2024-06-06 MED ORDER — DEXMEDETOMIDINE HCL IN NACL 80 MCG/20ML IV SOLN
INTRAVENOUS | Status: DC | PRN
Start: 2024-06-06 — End: 2024-06-07
  Administered 2024-06-06: 12 ug via INTRAVENOUS
  Administered 2024-06-06: 8 ug via INTRAVENOUS

## 2024-06-06 MED ORDER — PROPOFOL 10 MG/ML IV BOLUS
INTRAVENOUS | Status: DC | PRN
  Administered 2024-06-06 (×2): 50 mg via INTRAVENOUS

## 2024-06-06 NOTE — H&P (Signed)
 Outpatient short stay form Pre-procedure 06/06/2024 9:04 AM Zoya Sprecher K. Aundria, M.D.  Primary Physician: Oneil Pinal, M.D.  Reason for visit:  Hx of adenomatous colon polyp  History of present illness:  Mr. Peed reports overall doing well from a GI standpoint - he reports having regular BM typically. If gets constipation about 1x/month will need miralax which helps bowels. Overall denies any issues with diarrhea, lower abdominal pain. Very occasional bright red blood in stool with straining with constipation.  He denies any GERD, nausea, vomiting, epigastric pain, dysphagia. Hx adenomatous colon polyps 08/30/2018 - TA polyp x 1. Repeat colonoscopy recommended in 5 years. Denies alcohol tobacco and OTC NSAIDs. Does take Mobic, often on empty stomach. We discussed taking with food.    No current facility-administered medications for this encounter.  Current Outpatient Medications:    alfuzosin (UROXATRAL) 10 MG 24 hr tablet, Take 10 mg by mouth daily. Take with dinner, Disp: , Rfl:    allopurinol (ZYLOPRIM) 300 MG tablet, Take 300 mg by mouth every evening. , Disp: , Rfl:    ALPRAZolam (XANAX) 0.25 MG tablet, Take 0.25 mg by mouth 2 (two) times daily as needed for anxiety. , Disp: , Rfl:    aspirin EC 81 MG tablet, Take 81 mg by mouth daily. Stopped Sunday. 7/28, Disp: , Rfl:    Cholecalciferol 25 MCG (1000 UT) tablet, Take by mouth daily. , Disp: , Rfl:    colchicine 0.6 MG tablet, Take 2 tablets (1.2mg ) by mouth at first sign of gout flare followed by 1 tablet (0.6mg ) after 1 hour. (Max 1.8mg  within 1 hour), Disp: , Rfl:    docusate sodium (COLACE) 100 MG capsule, Take 100 mg by mouth daily., Disp: , Rfl:    lisinopril-hydrochlorothiazide (PRINZIDE,ZESTORETIC) 20-12.5 MG tablet, 0.5 tablets., Disp: , Rfl:    magnesium oxide (MAG-OX) 400 MG tablet, Take 500 mg by mouth daily. Stopped week ago, Disp: , Rfl:    meloxicam (MOBIC) 15 MG tablet, Take 15 mg by mouth daily., Disp: , Rfl:     sildenafil (REVATIO) 20 MG tablet, Take by mouth daily as needed. , Disp: , Rfl:    simvastatin (ZOCOR) 20 MG tablet, Take 20 mg by mouth. , Disp: , Rfl:   No medications prior to admission.     No Known Allergies   Past Medical History:  Diagnosis Date   Anxiety    history of anxiety/tx with xanax   Degenerative disc disease, cervical     C5-6   Ectopic beats    Gout    History of prostate biopsy 2017 & 2019   Hyperlipidemia    Hypertension    Nephrolithiasis    Nocturia    Prostate cancer ([facility])    Urinary frequency    Wears glasses    Wears hearing aid in both ears     Review of systems:  Otherwise negative.    Physical Exam  Gen: Alert, oriented. Appears stated age.  HEENT: [place]/AT. PERRLA. Lungs: CTA, no wheezes. CV: RR nl S1, S2. Abd: soft, benign, no masses. BS+ Ext: No edema. Pulses 2+    Planned procedures: Proceed with colonoscopy. The patient understands the nature of the planned procedure, indications, risks, alternatives and potential complications including but not limited to bleeding, infection, perforation, damage to internal organs and possible oversedation/side effects from anesthesia. The patient agrees and gives consent to proceed.  Please refer to procedure notes for findings, recommendations and patient disposition/instructions.     Jersie Beel K. Aundria, M.D.  Gastroenterology 06/06/2024  9:04 AM

## 2024-06-06 NOTE — Anesthesia Preprocedure Evaluation (Signed)
 Anesthesia Evaluation  Patient identified by MRN, date of birth, ID band Patient awake    Reviewed: Allergy & Precautions, NPO status , Patient's Chart, lab work & pertinent test results  Airway Mallampati: II  TM Distance: >3 FB Neck ROM: Full    Dental  (+) Missing   Pulmonary neg pulmonary ROS   Pulmonary exam normal breath sounds clear to auscultation       Cardiovascular Exercise Tolerance: Good hypertension, Pt. on medications negative cardio ROS Normal cardiovascular exam Rhythm:Regular Rate:Normal     Neuro/Psych   Anxiety     negative neurological ROS  negative psych ROS   GI/Hepatic negative GI ROS, Neg liver ROS,,,  Endo/Other  negative endocrine ROS  Class 3 obesity  Renal/GU negative Renal ROS  negative genitourinary   Musculoskeletal   Abdominal  (+) + obese  Peds negative pediatric ROS (+)  Hematology negative hematology ROS (+)   Anesthesia Other Findings Past Medical History: No date: Anxiety     Comment:  history of anxiety/tx with xanax No date: Degenerative disc disease, cervical     Comment:   C5-6 No date: Ectopic beats No date: Gout 2017 & 2019: History of prostate biopsy No date: Hyperlipidemia No date: Hypertension No date: Nephrolithiasis No date: Nocturia No date: Prostate cancer ([facility]) No date: Urinary frequency No date: Wears glasses No date: Wears hearing aid in both ears  Past Surgical History: 01/05/2022: CATARACT EXTRACTION W/PHACO; Right     Comment:  Procedure: CATARACT EXTRACTION PHACO AND INTRAOCULAR               LENS PLACEMENT ([facility]) RIGHT;  Surgeon: Jaye Fallow,               MD;  Location: [facility];  Service:               Ophthalmology;  Laterality: Right;  6.80 0:42.3 01/19/2022: CATARACT EXTRACTION W/PHACO; Left     Comment:  Procedure: CATARACT EXTRACTION PHACO AND INTRAOCULAR               LENS PLACEMENT ([facility]) LEFT 5.04 00:30.6;  Surgeon:                Jaye Fallow, MD;  Location: [facility];                Service: Ophthalmology;  Laterality: Left; 08/17/2017: COLONOSCOPY WITH PROPOFOL ; N/A     Comment:  Procedure: COLONOSCOPY WITH PROPOFOL ;  Surgeon: Toledo,               Ladell POUR, MD;  Location: [facility];  Service:               Gastroenterology;  Laterality: N/A; 08/30/2018: COLONOSCOPY WITH PROPOFOL ; N/A     Comment:  Procedure: COLONOSCOPY WITH PROPOFOL ;  Surgeon: Toledo,               Ladell POUR, MD;  Location: [facility];  Service:               Gastroenterology;  Laterality: N/A; No date: PROSTATE BIOPSY     Comment:  x2 03/13/2018: RADIOACTIVE SEED IMPLANT; N/A     Comment:  Procedure: RADIOACTIVE SEED IMPLANT/BRACHYTHERAPY               IMPLANT;  Surgeon: Ottelin, Mark, MD;  [facility]               [place];  Service: Urology;  Laterality: N/A; 03/13/2018: SPACE  OAR INSTILLATION; N/A     Comment:  Procedure: SPACE OAR INSTILLATION;  Surgeon: Ottelin,               Mark, MD;  Location: [facility];                Service: Urology;  Laterality: N/A;     Reproductive/Obstetrics negative OB ROS                              Anesthesia Physical Anesthesia Plan  ASA: 2  Anesthesia Plan: General   Post-op Pain Management:    Induction: Intravenous  PONV Risk Score and Plan: Propofol  infusion and TIVA  Airway Management Planned: Natural Airway and Nasal Cannula  Additional Equipment:   Intra-op Plan:   Post-operative Plan:   Informed Consent: I have reviewed the patients History and Physical, chart, labs and discussed the procedure including the risks, benefits and alternatives for the proposed anesthesia with the patient or authorized representative who has indicated his/her understanding and acceptance.     Dental Advisory Given  Plan Discussed with: CRNA  Anesthesia Plan Comments:         Anesthesia Quick  Evaluation

## 2024-06-06 NOTE — Op Note (Signed)
 [facility] Gastroenterology Patient Name: Antino Mayabb Procedure Date: 06/06/2024 10:13 AM MRN: [id number] Account #: [id number] Date of Birth: 18-Mar-1946 Admit Type: Outpatient Age: 78 Room: [facility] ENDO ROOM 2 Gender: Male Note Status: Finalized Instrument Name: Colon Scope [id number] Procedure:             Colonoscopy Indications:           High risk colon cancer surveillance: Personal history                         of non-advanced adenoma Providers:             Reneka Nebergall K. Kavir Savoca MD, MD Medicines:             Propofol  per Anesthesia Complications:         No immediate complications. Estimated blood loss: None. Procedure:             Pre-Anesthesia Assessment:                        - The risks and benefits of the procedure and the                         sedation options and risks were discussed with the                         patient. All questions were answered and informed                         consent was obtained.                        - Patient identification and proposed procedure were                         verified prior to the procedure by the nurse. The                         procedure was verified in the procedure room.                        - ASA Grade Assessment: III - A patient with severe                         systemic disease.                        - After reviewing the risks and benefits, the patient                         was deemed in satisfactory condition to undergo the                         procedure.                        After obtaining informed consent, the colonoscope was                         passed under direct vision. Throughout the procedure,  the patient's blood pressure, pulse, and oxygen                         saturations were monitored continuously. The                         Colonoscope was introduced through the anus and                         advanced to the the cecum, identified by  appendiceal                         orifice and ileocecal valve. The colonoscopy was                         performed without difficulty. The patient tolerated                         the procedure well. The quality of the bowel                         preparation was good. The ileocecal valve, appendiceal                         orifice, and rectum were photographed. Findings:      The perianal and digital rectal examinations were normal. Pertinent       negatives include normal sphincter tone and no palpable rectal lesions.      Internal hemorrhoids were found during retroflexion. The hemorrhoids       were small and Grade I (internal hemorrhoids that do not prolapse).      Many small-mouthed diverticula were found in the sigmoid colon and       descending colon. There was no evidence of diverticular bleeding.      The exam was otherwise without abnormality. Impression:            - Internal hemorrhoids.                        - Mild diverticulosis in the sigmoid colon and in the                         descending colon. There was no evidence of                         diverticular bleeding.                        - The examination was otherwise normal.                        - No specimens collected. Recommendation:        - Patient has a contact number available for                         emergencies. The signs and symptoms of potential                         delayed complications were discussed with the patient.  Return to normal activities tomorrow. Written                         discharge instructions were provided to the patient.                        - High fiber diet.                        - Continue present medications.                        - You do NOT require further colon cancer screening                         measures (Annual stool testing (i.e. hemoccult, FIT,                         cologuard), sigmoidoscopy, colonoscopy or CT                          colonography). You should share this recommendation                         with your Primary Care provider.                        - Return to GI office PRN.                        - The findings and recommendations were discussed with                         the patient. Procedure Code(s):     --- Professional ---                        H9894, Colorectal cancer screening; colonoscopy on                         individual at high risk Diagnosis Code(s):     --- Professional ---                        K57.30, Diverticulosis of large intestine without                         perforation or abscess without bleeding                        K64.0, First degree hemorrhoids                        Z86.010, Personal history of colonic polyps CPT copyright 2022 American Medical Association. All rights reserved. The codes documented in this report are preliminary and upon coder review may  be revised to meet current compliance requirements. Ladell MARLA Boss MD, MD 06/06/2024 10:38:05 AM This report has been signed electronically. Number of Addenda: 0 Note Initiated On: 06/06/2024 10:13 AM Scope Withdrawal Time: 0 hours 6 minutes 23 seconds  Total Procedure Duration: 0 hours 9 minutes 30 seconds  Estimated Blood Loss:  Estimated blood loss: none.  [facility]

## 2024-06-06 NOTE — Interval H&P Note (Signed)
 History and Physical Interval Note:  06/06/2024 9:06 AM  Christian JONETTA Miu Sr.  has presented today for surgery, with the diagnosis of Personal history of adenomatous and serrated colon polyps (Z86.0101).  The various methods of treatment have been discussed with the patient and family. After consideration of risks, benefits and other options for treatment, the patient has consented to  Procedure(s): COLONOSCOPY (N/A) as a surgical intervention.  The patient's history has been reviewed, patient examined, no change in status, stable for surgery.  I have reviewed the patient's chart and labs.  Questions were answered to the patient's satisfaction.     [place], Destenie Ingber

## 2024-06-07 ENCOUNTER — Encounter: Payer: Self-pay | Admitting: Internal Medicine

## 2024-06-07 NOTE — Transfer of Care (Signed)
 Immediate Anesthesia Transfer of Care Note  Patient: Christian Pope.  Procedure(s) Performed: COLONOSCOPY  Patient Location: PACU  Anesthesia Type:General  Level of Consciousness: sedated  Airway & Oxygen Therapy: Patient Spontanous Breathing  Post-op Assessment: Report given to RN and Post -op Vital signs reviewed and stable  Post vital signs: Reviewed and stable  Last Vitals:  Vitals Value Taken Time  BP 115/69 06/06/24 10:59  Temp    Pulse 64 06/06/24 10:49  Resp 18 06/06/24 10:59  SpO2 100 % 06/06/24 10:59  Vitals shown include unfiled device data.  Last Pain:  Vitals:   06/07/24 0735  TempSrc:   PainSc: 0-No pain         Complications: No notable events documented.

## 2024-06-07 NOTE — Anesthesia Postprocedure Evaluation (Signed)
 Anesthesia Post Note  Patient: Christian SPERL Sr.  Procedure(s) Performed: COLONOSCOPY  Patient location during evaluation: PACU Anesthesia Type: General Level of consciousness: awake Pain management: satisfactory to patient Vital Signs Assessment: post-procedure vital signs reviewed and stable Respiratory status: spontaneous breathing Cardiovascular status: stable Anesthetic complications: no   No notable events documented.   Last Vitals:  Vitals:   06/06/24 1049 06/06/24 1059  BP: 106/65 115/69  Pulse: 64   Resp: 12 18  Temp:    SpO2: 99% 100%    Last Pain:  Vitals:   06/06/24 1059  TempSrc:   PainSc: 0-No pain                 [place]ER[place],Chika Cichowski
